# Patient Record
Sex: Female | Born: 1945 | Race: Black or African American | Hispanic: No | Smoking: Never smoker
Health system: Southern US, Community
[De-identification: ages and names within clinical notes are randomized; demographics above are authoritative.]

## PROBLEM LIST (undated history)

## (undated) DIAGNOSIS — G894 Chronic pain syndrome: Secondary | ICD-10-CM

## (undated) DIAGNOSIS — S0501XA Injury of conjunctiva and corneal abrasion without foreign body, right eye, initial encounter: Secondary | ICD-10-CM

## (undated) DIAGNOSIS — S0502XA Injury of conjunctiva and corneal abrasion without foreign body, left eye, initial encounter: Secondary | ICD-10-CM

## (undated) DIAGNOSIS — F32A Depression, unspecified: Secondary | ICD-10-CM

## (undated) DIAGNOSIS — R1310 Dysphagia, unspecified: Secondary | ICD-10-CM

## (undated) DIAGNOSIS — J42 Unspecified chronic bronchitis: Secondary | ICD-10-CM

## (undated) DIAGNOSIS — I10 Essential (primary) hypertension: Secondary | ICD-10-CM

## (undated) DIAGNOSIS — S134XXA Sprain of ligaments of cervical spine, initial encounter: Secondary | ICD-10-CM

## (undated) DIAGNOSIS — I824Z2 Acute embolism and thrombosis of unspecified deep veins of left distal lower extremity: Secondary | ICD-10-CM

## (undated) DIAGNOSIS — M109 Gout, unspecified: Secondary | ICD-10-CM

## (undated) DIAGNOSIS — M4322 Fusion of spine, cervical region: Secondary | ICD-10-CM

## (undated) DIAGNOSIS — G629 Polyneuropathy, unspecified: Secondary | ICD-10-CM

## (undated) DIAGNOSIS — F329 Major depressive disorder, single episode, unspecified: Secondary | ICD-10-CM

## (undated) HISTORY — PX: OTHER SURGICAL HISTORY: SHX169

## (undated) HISTORY — DX: Essential (primary) hypertension: I10

## (undated) HISTORY — DX: Injury of conjunctiva and corneal abrasion without foreign body, right eye, initial encounter: S05.02XA

## (undated) HISTORY — DX: Acute embolism and thrombosis of unspecified deep veins of left distal lower extremity: I82.4Z2

## (undated) HISTORY — DX: Sprain of ligaments of cervical spine, initial encounter: S13.4XXA

## (undated) HISTORY — PX: TUBAL LIGATION: SHX77

## (undated) HISTORY — DX: Fusion of spine, cervical region: M43.22

## (undated) HISTORY — PX: CHOLECYSTECTOMY: SHX55

## (undated) HISTORY — DX: Injury of conjunctiva and corneal abrasion without foreign body, right eye, initial encounter: S05.01XA

---
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
## 2016-05-30 DIAGNOSIS — S134XXA Sprain of ligaments of cervical spine, initial encounter: Secondary | ICD-10-CM

## 2016-05-30 HISTORY — DX: Sprain of ligaments of cervical spine, initial encounter: S13.4XXA

## 2016-06-03 DIAGNOSIS — R079 Chest pain, unspecified: Secondary | ICD-10-CM | POA: Insufficient documentation

## 2016-06-03 DIAGNOSIS — E669 Obesity, unspecified: Secondary | ICD-10-CM | POA: Insufficient documentation

## 2016-06-03 DIAGNOSIS — S0003XA Contusion of scalp, initial encounter: Secondary | ICD-10-CM | POA: Insufficient documentation

## 2016-06-03 DIAGNOSIS — S3011XA Contusion of abdominal wall, initial encounter: Secondary | ICD-10-CM | POA: Insufficient documentation

## 2016-06-03 DIAGNOSIS — E876 Hypokalemia: Secondary | ICD-10-CM | POA: Insufficient documentation

## 2016-06-03 DIAGNOSIS — M4320 Fusion of spine, site unspecified: Secondary | ICD-10-CM | POA: Insufficient documentation

## 2016-06-03 DIAGNOSIS — S0501XA Injury of conjunctiva and corneal abrasion without foreign body, right eye, initial encounter: Secondary | ICD-10-CM | POA: Insufficient documentation

## 2016-06-03 DIAGNOSIS — S301XXA Contusion of abdominal wall, initial encounter: Secondary | ICD-10-CM | POA: Insufficient documentation

## 2016-06-03 DIAGNOSIS — R002 Palpitations: Secondary | ICD-10-CM | POA: Insufficient documentation

## 2016-06-03 DIAGNOSIS — R7303 Prediabetes: Secondary | ICD-10-CM | POA: Insufficient documentation

## 2016-06-03 DIAGNOSIS — S0502XA Injury of conjunctiva and corneal abrasion without foreign body, left eye, initial encounter: Secondary | ICD-10-CM | POA: Insufficient documentation

## 2016-06-03 DIAGNOSIS — Z981 Arthrodesis status: Secondary | ICD-10-CM | POA: Insufficient documentation

## 2016-06-14 DIAGNOSIS — D500 Iron deficiency anemia secondary to blood loss (chronic): Secondary | ICD-10-CM | POA: Insufficient documentation

## 2016-06-14 DIAGNOSIS — I824Z2 Acute embolism and thrombosis of unspecified deep veins of left distal lower extremity: Secondary | ICD-10-CM | POA: Insufficient documentation

## 2016-07-14 ENCOUNTER — Non-Acute Institutional Stay (SKILLED_NURSING_FACILITY): Payer: Medicare Other | Admitting: Internal Medicine

## 2016-07-14 ENCOUNTER — Encounter: Payer: Self-pay | Admitting: Internal Medicine

## 2016-07-14 DIAGNOSIS — S14109D Unspecified injury at unspecified level of cervical spinal cord, subsequent encounter: Secondary | ICD-10-CM

## 2016-07-14 DIAGNOSIS — R21 Rash and other nonspecific skin eruption: Secondary | ICD-10-CM

## 2016-07-14 DIAGNOSIS — I82402 Acute embolism and thrombosis of unspecified deep veins of left lower extremity: Secondary | ICD-10-CM | POA: Diagnosis not present

## 2016-07-14 DIAGNOSIS — I10 Essential (primary) hypertension: Secondary | ICD-10-CM

## 2016-07-14 DIAGNOSIS — J309 Allergic rhinitis, unspecified: Secondary | ICD-10-CM

## 2016-07-14 DIAGNOSIS — F329 Major depressive disorder, single episode, unspecified: Secondary | ICD-10-CM

## 2016-07-14 DIAGNOSIS — R1319 Other dysphagia: Secondary | ICD-10-CM | POA: Diagnosis not present

## 2016-07-14 DIAGNOSIS — S52501S Unspecified fracture of the lower end of right radius, sequela: Secondary | ICD-10-CM

## 2016-07-14 DIAGNOSIS — R531 Weakness: Secondary | ICD-10-CM | POA: Diagnosis not present

## 2016-07-14 DIAGNOSIS — R42 Dizziness and giddiness: Secondary | ICD-10-CM

## 2016-07-14 DIAGNOSIS — K5900 Constipation, unspecified: Secondary | ICD-10-CM | POA: Diagnosis not present

## 2016-07-14 DIAGNOSIS — M792 Neuralgia and neuritis, unspecified: Secondary | ICD-10-CM | POA: Diagnosis not present

## 2016-07-14 DIAGNOSIS — E785 Hyperlipidemia, unspecified: Secondary | ICD-10-CM

## 2016-07-14 DIAGNOSIS — K219 Gastro-esophageal reflux disease without esophagitis: Secondary | ICD-10-CM | POA: Diagnosis not present
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
## 2016-07-15 LAB — CBC AND DIFFERENTIAL
HCT: 31 % — AB (ref 36–46)
Hemoglobin: 10 g/dL — AB (ref 12.0–16.0)
Platelets: 288 10*3/uL (ref 150–399)
WBC: 6 10^3/mL

## 2016-07-15 LAB — HEPATIC FUNCTION PANEL
ALT: 19 U/L (ref 7–35)
AST: 14 U/L (ref 13–35)
Alkaline Phosphatase: 88 U/L (ref 25–125)
Bilirubin, Total: 0.4 mg/dL

## 2016-07-15 LAB — BASIC METABOLIC PANEL
BUN: 14 mg/dL (ref 4–21)
CREATININE: 0.7 mg/dL (ref 0.5–1.1)
Glucose: 97 mg/dL
POTASSIUM: 3.7 mmol/L (ref 3.4–5.3)
Sodium: 144 mmol/L (ref 137–147)

## 2016-07-28 ENCOUNTER — Non-Acute Institutional Stay (SKILLED_NURSING_FACILITY): Payer: Medicare Other | Admitting: Family

## 2016-07-28 DIAGNOSIS — R030 Elevated blood-pressure reading, without diagnosis of hypertension: Secondary | ICD-10-CM

## 2016-07-28 DIAGNOSIS — M79605 Pain in left leg: Secondary | ICD-10-CM | POA: Diagnosis not present

## 2016-07-28 NOTE — Progress Notes (Signed)
Location:  [facility] Room Number: 603  Place of Service:  SNF [phone] Provider: Richarda Bladeinah Taevyn Hausen FNP-C   No care team member to display  Extended Emergency Contact Information Primary Emergency Contact: Howard Young Med CtrMcRae,Debbie Address: [address]          [place], [place] [address] [place] Mobile Phone: [phone] Relation: Daughter Secondary Emergency Contact: Crawford,Tina  [place] Mobile Phone: [phone] Relation: Daughter  Code Status:  Full Code  Goals of care: Advanced Directive information No flowsheet data found.   Chief Complaint  Patient presents with  . Acute Visit    Left leg pain     HPI:  Pt is a 70 y.o. female [facility] for an acute visit for evaluation of pain. She has a medical history of HTN, Hyperlipidemia, GERD among other conditions. She is seen in her room today. She complains of generalized pain but states worst on left leg. She has taken Tylenol without any relief. She states tried to tolerate pain at times.She continues to work with PT/OT.Facility Nurse reports elevated B/P this visit but possible due to pain. Will monitor for now.    Past Medical History:  Diagnosis Date  . Hypertension    No past surgical history on file.  Allergies  Allergen Reactions  . Ivp Dye [Iodinated Diagnostic Agents]   . Penicillins       Medication List       Accurate as of 07/28/16  3:35 PM. Always use your most recent med list.          acetaminophen 325 MG tablet Commonly known as:  TYLENOL Take 650 mg by mouth every 6 (six) hours as needed for mild pain.   allopurinol 100 MG tablet Commonly known as:  ZYLOPRIM Place 100 mg into feeding tube daily.   alum & mag hydroxide-simeth 400-400-40 MG/5ML suspension Commonly known as:  MAALOX PLUS Take 30 mLs by mouth every 6 (six) hours as needed for indigestion. Stop date 07/15/16   amLODipine 10 MG  tablet Commonly known as:  NORVASC Place 10 mg into feeding tube daily.   atorvastatin 40 MG tablet Commonly known as:  LIPITOR Place 40 mg into feeding tube at bedtime.   baclofen 10 MG tablet Commonly known as:  LIORESAL Place 15 mg into feeding tube at bedtime.   bisacodyl 10 MG suppository Commonly known as:  DULCOLAX Place 10 mg rectally every other day.   calcium carbonate 500 MG chewable tablet Commonly known as:  TUMS - dosed in mg elemental calcium Chew 1 tablet by mouth every 4 (four) hours as needed for indigestion or heartburn.   carboxymethylcellulose 0.5 % Soln Commonly known as:  REFRESH PLUS Place 1 drop into both eyes 4 (four) times daily.   carvedilol 6.25 MG tablet Commonly known as:  COREG Place 6.25 mg into feeding tube 2 (two) times daily with a meal.   diclofenac sodium 1 % Gel Commonly known as:  VOLTAREN Apply 2 g topically 4 (four) times daily.   docusate 50 MG/5ML liquid Commonly known as:  COLACE Place into feeding tube 2 (two) times daily. Give 10 mL   FIORICET/CODEINE 50-300-40-30 MG Caps Generic drug:  Butalbital-APAP-Caff-Cod Place 1 capsule into feeding tube every 4 (four) hours as needed.   gabapentin 400 MG capsule Commonly known as:  NEURONTIN Take 500 mg by mouth 3 (three) times daily. Take along with Gabapentin 100 mg Tablet  gabapentin 100 MG capsule Commonly known as:  NEURONTIN Take 100 mg by mouth 3 (three) times daily. Take along with 400 mg Tablet   guaiFENesin 100 MG/5ML liquid Commonly known as:  ROBITUSSIN Place into feeding tube every 4 (four) hours as needed for cough. Give 10 mL   lidocaine 5 % Commonly known as:  LIDODERM Place 1 patch onto the skin daily. Remove & Discard patch within 12 hours or as directed by MD   meclizine 12.5 MG tablet Commonly known as:  ANTIVERT Place 12.5 mg into feeding tube 3 (three) times daily as needed for dizziness.   Melatonin 3 MG Tabs Take 3 mg by mouth at bedtime.    montelukast 10 MG tablet Commonly known as:  SINGULAIR Place 10 mg into feeding tube at bedtime.   nystatin powder Commonly known as:  MYCOSTATIN/NYSTOP Apply topically 2 (two) times daily.   omeprazole 40 MG capsule Commonly known as:  PRILOSEC Take 40 mg by mouth daily.   ondansetron 4 MG tablet Commonly known as:  ZOFRAN Take 4 mg by mouth every 6 (six) hours as needed for nausea or vomiting. Stop date 07/19/16   rivaroxaban 20 MG Tabs tablet Commonly known as:  XARELTO Take 20 mg by mouth daily with supper.   senna 8.6 MG tablet Commonly known as:  SENOKOT Place 2 tablets into feeding tube daily.       Review of Systems  Constitutional: Negative for activity change, appetite change, chills, fatigue and fever.  HENT: Negative for congestion, rhinorrhea, sinus pressure, sneezing and sore throat.   Eyes: Negative.   Respiratory: Negative for cough, choking, chest tightness, shortness of breath and wheezing.   Cardiovascular: Negative for chest pain, palpitations and leg swelling.  Gastrointestinal: Negative for abdominal distention, abdominal pain, blood in stool, constipation, diarrhea, nausea and vomiting.  Endocrine: Negative.   Genitourinary: Negative for difficulty urinating, dysuria, frequency and urgency.  Musculoskeletal: Positive for gait problem.       Generalized pain but worst on left leg Tylenol ineffective  Skin: Negative for color change, pallor and rash.  Neurological: Negative for dizziness, seizures, syncope, light-headedness and headaches.  Hematological: Does not bruise/bleed easily.  Psychiatric/Behavioral: Negative for agitation, confusion, hallucinations and sleep disturbance. The patient is not nervous/anxious.      There is no immunization history on file for this patient. There are no preventive care reminders to display for this patient. No flowsheet data found. Functional Status Survey:    Vitals:   07/28/16 1130  BP: (!) 163/74   Pulse: 88  Resp: 18  Temp: 97.3 F (36.3 C)  SpO2: 97%  Weight: 208 lb 9.6 oz (94.6 kg)  Height: 5\' 2"  (1.575 m)   Body mass index is 38.15 kg/m. Physical Exam  Constitutional: She is oriented to person, place, and time. She appears well-developed and well-nourished. No distress.  HENT:  Head: Normocephalic.  Mouth/Throat: Oropharynx is clear and moist. No oropharyngeal exudate.  Eyes: Conjunctivae and EOM are normal. Pupils are equal, round, and reactive to light. Right eye exhibits no discharge. Left eye exhibits no discharge. No scleral icterus.  Neck: Normal range of motion. No JVD present. No thyromegaly present.  Cardiovascular: Normal rate, regular rhythm, normal heart sounds and intact distal pulses.  Exam reveals no gallop and no friction rub.   No murmur heard. Pulmonary/Chest: Effort normal and breath sounds normal. No respiratory distress. She has no wheezes. She has no rales.  Abdominal: Soft. Bowel sounds are normal. She exhibits  no distension. There is no tenderness. There is no rebound and no guarding.  Musculoskeletal: She exhibits no edema, tenderness or deformity.  Moves x 4 extremities. Wears hard  Neck collar Bilat. Ted hose in place.   Lymphadenopathy:    She has no cervical adenopathy.  Neurological: She is oriented to person, place, and time.  Skin: Skin is warm and dry. No rash noted. No erythema. No pallor.  Left ankle drsg dry, clean and intact   Psychiatric: She has a normal mood and affect.    Assessment/Plan 1. Left leg pain Tylenol ineffective. Will add Tramadol 50 mg Tablet 1/2-1 Tablet  by mouth every 6 hours for pain. Hold for sedation. One tablet for moderate pain and two tablets for severe pain.   2. Elevated Blood pressure  Suspect due to pain. Continue on Amlodipine 10 mg Tablet. Monitor B/P and HR every shift x 5 days. Facility Nurse to notify Provider if SBP > 160     Family/ staff Communication: Reviewed plan of care with patient and  facility Nurse supervisor.   Labs/tests ordered: None

## 2016-08-24 LAB — HEPATIC FUNCTION PANEL
ALT: 37 U/L — AB (ref 7–35)
AST: 27 U/L (ref 13–35)
Alkaline Phosphatase: 88 U/L (ref 25–125)
Bilirubin, Direct: 0.07 mg/dL (ref 0.01–0.4)
Bilirubin, Total: 0.4 mg/dL

## 2016-08-27 ENCOUNTER — Non-Acute Institutional Stay (SKILLED_NURSING_FACILITY): Payer: Medicare Other | Admitting: Family

## 2016-08-27 ENCOUNTER — Encounter: Payer: Self-pay | Admitting: Family

## 2016-08-27 DIAGNOSIS — R140 Abdominal distension (gaseous): Secondary | ICD-10-CM

## 2016-08-27 NOTE — Progress Notes (Signed)
Location:  [facility] Room Number: 603-P Place of Service:  SNF [phone] Provider: Richarda Bladeinah Ngetich FNP-C   No care team member to display  Extended Emergency Contact Information Primary Emergency Contact: [facility] Address: [address]          [place], [place] [address] [place] Mobile Phone: [phone] Relation: Daughter Secondary Emergency Contact: Crawford,Tina  [place] Mobile Phone: [phone] Relation: Daughter  Code Status:  Full Code  Goals of care: Advanced Directive information No flowsheet data found.   Chief Complaint  Patient presents with  . Acute Visit    "Stomach feels swollen"    HPI:  Brandy Edwards is a 70 y.o. female seen today at [facility] for an acute  visit for evaluation of abdominal bloating. Brandy Edwards has a medical history of HTN, Hyperlipidemia, GERD among other conditions. Brandy Edwards is seen in her room today. Brandy Edwards states her stomach feels like its more swollen than usual for the past two days. Also states feels bloated.States had a small BM previous day. Brandy Edwards request PEG tube to be taken out currently tolerating oral intake. Brandy Edwards is aware of upcoming appointment  with Gastroenterology in January 2018 for PEG tube removal.  Brandy Edwards denies any abdominal pain fever, chills, nausea or vomiting.   Past Medical History:  Diagnosis Date  . Acute deep vein thrombosis (DVT) of distal end of left lower extremity (HCC)   . Bilateral corneal abrasions   . Fusion of spine, cervical region   . Hypertension   . Injury to ligament of cervical spine 05/2016   Past Surgical History:  Procedure Laterality Date  . CORPECTOMY C6      Allergies  Allergen Reactions  . Ivp Dye [Iodinated Diagnostic Agents]   . Penicillins     Allergies as of 08/27/2016      Reactions   Ivp Dye [iodinated Diagnostic Agents]    Penicillins       Medication List       Accurate as of 08/27/16 12:44 PM. Always use your  most recent med list.          acetaminophen 325 MG tablet Commonly known as:  TYLENOL Take 650 mg by mouth every 6 (six) hours as needed for mild pain.   allopurinol 100 MG tablet Commonly known as:  ZYLOPRIM Place 100 mg into feeding tube daily.   alum & mag hydroxide-simeth 400-400-40 MG/5ML suspension Commonly known as:  MAALOX PLUS Take 30 mLs by mouth every 6 (six) hours as needed for indigestion. Stop date 07/15/16   amLODipine 10 MG tablet Commonly known as:  NORVASC Place 10 mg into feeding tube daily.   atorvastatin 40 MG tablet Commonly known as:  LIPITOR Place 40 mg into feeding tube at bedtime.   baclofen 10 MG tablet Commonly known as:  LIORESAL Place 15 mg into feeding tube at bedtime. 1-1/2 tabs to = 15 mg   baclofen 10 MG tablet Commonly known as:  LIORESAL Take 10 mg by mouth 2 (two) times daily.   bisacodyl 10 MG suppository Commonly known as:  DULCOLAX Place 10 mg rectally daily as needed for moderate constipation.   calcium carbonate 500 MG chewable tablet Commonly known as:  TUMS - dosed in mg elemental calcium Chew 1 tablet by mouth every 4 (four) hours as needed for indigestion or heartburn.   Carboxymethylcellulose Sod PF 0.25 % Soln Apply 1 drop to eye 4 (four) times daily.  carvedilol 6.25 MG tablet Commonly known as:  COREG Place 6.25 mg into feeding tube 2 (two) times daily with a meal.   DESOWEN 0.05 % lotion Generic drug:  desonide Apply 1 application topically 2 (two) times daily. Apply to peri-nasal/nose   diclofenac sodium 1 % Gel Commonly known as:  VOLTAREN Apply 2 g topically 4 (four) times daily.   docusate sodium 100 MG capsule Commonly known as:  COLACE Take 100 mg by mouth 2 (two) times daily.   feeding supplement (PRO-STAT SUGAR FREE 64) Liqd Take 30 mLs by mouth 2 (two) times daily.   FIORICET/CODEINE 50-300-40-30 MG Caps Generic drug:  Butalbital-APAP-Caff-Cod Place 1 capsule into feeding tube every 4 (four)  hours as needed.   gabapentin 400 MG capsule Commonly known as:  NEURONTIN Take 400 mg by mouth 3 (three) times daily. Take along with Gabapentin 100 mg Tablet   gabapentin 100 MG capsule Commonly known as:  NEURONTIN Take 100 mg by mouth 3 (three) times daily. Take along with 400 mg Tablet   guaiFENesin 100 MG/5ML liquid Commonly known as:  ROBITUSSIN Place into feeding tube every 4 (four) hours as needed for cough. Give 10 mL   lidocaine 5 % Commonly known as:  LIDODERM Place 1 patch onto the skin daily. Remove & Discard patch within 12 hours or as directed by MD   meclizine 12.5 MG tablet Commonly known as:  ANTIVERT Place 12.5 mg into feeding tube 3 (three) times daily as needed for dizziness.   Melatonin 3 MG Tabs Take 3 mg by mouth at bedtime.   montelukast 10 MG tablet Commonly known as:  SINGULAIR Place 10 mg into feeding tube at bedtime.   nystatin powder Commonly known as:  MYCOSTATIN/NYSTOP Apply topically 2 (two) times daily. Apply to skin fold fungal rash   omeprazole 40 MG capsule Commonly known as:  PRILOSEC Take 40 mg by mouth 2 (two) times daily.   rivaroxaban 20 MG Tabs tablet Commonly known as:  XARELTO 20 mg daily with supper. Crush and administer via PEG tube   senna 8.6 MG tablet Commonly known as:  SENOKOT Place 1 tablet into feeding tube daily.   sertraline 25 MG tablet Commonly known as:  ZOLOFT Place 25 mg into feeding tube daily.   traMADol 50 MG tablet Commonly known as:  ULTRAM Take 25-50 mg by mouth every 6 (six) hours as needed for moderate pain or severe pain. Take 1/2 tablet to = 25 mg for mild to moderate pain, take 50 mg for severe pain       Review of Systems  Constitutional: Negative for activity change, appetite change, chills, fatigue and fever.  HENT: Negative for congestion, rhinorrhea, sinus pressure, sneezing and sore throat.   Eyes: Negative.   Respiratory: Negative for cough, choking, chest tightness, shortness  of breath and wheezing.   Cardiovascular: Negative for chest pain, palpitations and leg swelling.  Gastrointestinal: Positive for abdominal distention. Negative for abdominal pain, blood in stool, constipation, diarrhea, nausea and vomiting.  Endocrine: Negative.   Genitourinary: Negative for difficulty urinating, dysuria, frequency and urgency.  Musculoskeletal: Positive for gait problem.  Skin: Negative for color change, pallor and rash.  Neurological: Negative for dizziness, seizures, syncope, light-headedness and headaches.  Psychiatric/Behavioral: Negative for agitation, confusion, hallucinations and sleep disturbance. The patient is not nervous/anxious.      There is no immunization history on file for this patient. Pertinent  Health Maintenance Due  Topic Date Due  . MAMMOGRAM  07/16/1994  .  COLONOSCOPY  07/16/1994  . DEXA SCAN  07/16/2009  . INFLUENZA VACCINE  08/30/2017 (Originally 03/30/2016)  . PNA vac Low Risk Adult (1 of 2 - PCV13) 08/30/2017 (Originally 07/16/2009)    Vitals:   08/27/16 0843  BP: 101/60  Pulse: 72  Resp: 17  Temp: 97 F (36.1 C)  TempSrc: Oral  SpO2: 96%  Weight: 208 lb 8.9 oz (94.6 kg)  Height: 5\' 2"  (1.575 m)   Body mass index is 38.15 kg/m. Physical Exam  Constitutional: Brandy Edwards is oriented to person, place, and time. Brandy Edwards appears well-developed and well-nourished. No distress.  HENT:  Head: Normocephalic.  Mouth/Throat: Oropharynx is clear and moist. No oropharyngeal exudate.  Eyes: Conjunctivae and EOM are normal. Pupils are equal, round, and reactive to light. Right eye exhibits no discharge. Left eye exhibits no discharge. No scleral icterus.  Neck: Normal range of motion. No JVD present. No thyromegaly present.  Cardiovascular: Normal rate, regular rhythm, normal heart sounds and intact distal pulses.  Exam reveals no gallop and no friction rub.   No murmur heard. Pulmonary/Chest: Effort normal and breath sounds normal. No respiratory  distress. Brandy Edwards has no wheezes. Brandy Edwards has no rales.  Abdominal: Soft. Brandy Edwards exhibits no distension. There is no tenderness. There is no rebound and no guarding.  Hyperactive bowel sounds upper quadrant. PEG tube.   Musculoskeletal: Brandy Edwards exhibits no edema, tenderness or deformity.  Moves x 4 extremities. Wears hard  Neck collar Bilat. Ted hose in place.   Lymphadenopathy:    Brandy Edwards has no cervical adenopathy.  Neurological: Brandy Edwards is oriented to person, place, and time.  Skin: Skin is warm and dry. No rash noted. No erythema. No pallor.  Psychiatric: Brandy Edwards has a normal mood and affect.    Assessment/Plan Abdominal bloating Feels like the abdomen is swollen. Exam findings + BS but hyperactive.Had small BM 08/26/2016. Will obtain portable KUB rule out obstruction and constipation.     Family/ staff Communication: Reviewed plan of care with patient and facility Nurse supervisor.   Labs/tests ordered: Portable KUB rule out constipation.

## 2016-09-23 ENCOUNTER — Non-Acute Institutional Stay (SKILLED_NURSING_FACILITY): Payer: Medicare Other | Admitting: Family

## 2016-09-23 DIAGNOSIS — I10 Essential (primary) hypertension: Secondary | ICD-10-CM

## 2016-09-23 DIAGNOSIS — L89152 Pressure ulcer of sacral region, stage 2: Secondary | ICD-10-CM

## 2016-09-23 DIAGNOSIS — G629 Polyneuropathy, unspecified: Secondary | ICD-10-CM

## 2016-09-23 DIAGNOSIS — G4700 Insomnia, unspecified: Secondary | ICD-10-CM | POA: Diagnosis not present

## 2016-09-23 DIAGNOSIS — F329 Major depressive disorder, single episode, unspecified: Secondary | ICD-10-CM

## 2016-09-23 DIAGNOSIS — F32A Depression, unspecified: Secondary | ICD-10-CM

## 2016-09-23 DIAGNOSIS — Z931 Gastrostomy status: Secondary | ICD-10-CM

## 2016-09-23 NOTE — Progress Notes (Signed)
Location:  [facility] Room Number: 603 P  Place of Service:  SNF [phone]) Provider: Richarda Bladeinah Sanvi Ehler FNP-C   No care team member to display  Extended Emergency Contact Information Primary Emergency Contact: [facility]Deb[facility] Address: [address]          [place], [place] [address] [place] Mobile Phone: [phone] Relation: Daughter Secondary Emergency Contact: Crawford,Tina  [place] Mobile Phone: [phone] Relation: Daughter  Code Status:  Full Code  Goals of care: Advanced Directive information No flowsheet data found.   Chief Complaint  Patient presents with  . Medical Management of Chronic Issues    HPI:  Pt is a 71 y.o. female seen today at [facility] for medical management of chronic issues.She has a medical history of HTN, Hyperlipidemia, GERD, depression, Neuropathy, Gout among other conditions. She is seen in her room today.She complains of left hand pain states awaiting pain medication to be brought by Nurse.Her PEG tube was removed by Gastroenterologist site healed without any complication. She tolerating oral intake with any difficulties.She has a stage 2 sacral pressure ulcer managed by facility wound care Nurse.She has had no recent fall episodes, weight changes or illness.    Past Medical History:  Diagnosis Date  . Acute deep vein thrombosis (DVT) of distal end of left lower extremity (HCC)   . Bilateral corneal abrasions   . Fusion of spine, cervical region   . Hypertension   . Injury to ligament of cervical spine 05/2016   Past Surgical History:  Procedure Laterality Date  . CORPECTOMY C6      Allergies  Allergen Reactions  . Ivp Dye [Iodinated Diagnostic Agents]   . Penicillins     Allergies as of 09/23/2016      Reactions   Ivp Dye [iodinated Diagnostic Agents]    Penicillins       Medication List       Accurate as of 09/23/16  5:53 PM. Always use your most  recent med list.          acetaminophen 325 MG tablet Commonly known as:  TYLENOL Take 650 mg by mouth every 6 (six) hours as needed for mild pain.   allopurinol 100 MG tablet Commonly known as:  ZYLOPRIM Take 100 mg by mouth daily.   alum & mag hydroxide-simeth 400-400-40 MG/5ML suspension Commonly known as:  MAALOX PLUS Take 30 mLs by mouth every 6 (six) hours as needed for indigestion. Stop date 07/15/16   amLODipine 10 MG tablet Commonly known as:  NORVASC Take 10 mg by mouth daily.   atorvastatin 40 MG tablet Commonly known as:  LIPITOR Take 40 mg by mouth at bedtime.   baclofen 10 MG tablet Commonly known as:  LIORESAL Take 15 mg by mouth at bedtime. 1-1/2 tabs to = 15 mg   baclofen 10 MG tablet Commonly known as:  LIORESAL Take 10 mg by mouth 2 (two) times daily.   bisacodyl 10 MG suppository Commonly known as:  DULCOLAX Place 10 mg rectally daily as needed for moderate constipation.   calcium carbonate 500 MG chewable tablet Commonly known as:  TUMS - dosed in mg elemental calcium Chew 1 tablet by mouth every 4 (four) hours as needed for indigestion or heartburn.   Carboxymethylcellulose Sod PF 0.25 % Soln Apply 1 drop to eye 4 (four) times daily.   carvedilol 6.25 MG tablet Commonly known as:  COREG Take 6.25 mg by mouth 2 (  two) times daily with a meal.   DESOWEN 0.05 % lotion Generic drug:  desonide Apply 1 application topically 2 (two) times daily. Apply to peri-nasal/nose   diclofenac sodium 1 % Gel Commonly known as:  VOLTAREN Apply 2 g topically 4 (four) times daily.   docusate sodium 100 MG capsule Commonly known as:  COLACE Take 100 mg by mouth 2 (two) times daily.   FIORICET/CODEINE 50-300-40-30 MG Caps Generic drug:  Butalbital-APAP-Caff-Cod Take 1 capsule by mouth every 4 (four) hours as needed.   gabapentin 400 MG capsule Commonly known as:  NEURONTIN Take 400 mg by mouth 3 (three) times daily. Take along with Gabapentin 100 mg  Tablet   gabapentin 100 MG capsule Commonly known as:  NEURONTIN Take 100 mg by mouth 3 (three) times daily. Take along with 400 mg Tablet   guaiFENesin 100 MG/5ML liquid Commonly known as:  ROBITUSSIN Take by mouth every 4 (four) hours as needed for cough. Give 10 mL   lidocaine 5 % Commonly known as:  LIDODERM Place 1 patch onto the skin daily. Remove & Discard patch within 12 hours or as directed by MD   meclizine 12.5 MG tablet Commonly known as:  ANTIVERT Take 12.5 mg by mouth 3 (three) times daily as needed for dizziness.   Melatonin 3 MG Tabs Take 3 mg by mouth at bedtime.   montelukast 10 MG tablet Commonly known as:  SINGULAIR Take 10 mg by mouth at bedtime.   nystatin powder Commonly known as:  MYCOSTATIN/NYSTOP Apply topically 2 (two) times daily. Apply to skin fold fungal rash   omeprazole 40 MG capsule Commonly known as:  PRILOSEC Take 40 mg by mouth 2 (two) times daily.   rivaroxaban 20 MG Tabs tablet Commonly known as:  XARELTO 20 mg daily with supper. Crush and administer via PEG tube   senna 8.6 MG tablet Commonly known as:  SENOKOT Take 1 tablet by mouth daily.   sertraline 25 MG tablet Commonly known as:  ZOLOFT Take 25 mg by mouth daily.   traMADol 50 MG tablet Commonly known as:  ULTRAM Take 25-50 mg by mouth every 6 (six) hours as needed for moderate pain or severe pain. Take 1/2 tablet to = 25 mg for mild to moderate pain, take 50 mg for severe pain       Review of Systems  Constitutional: Negative for activity change, appetite change, chills, fatigue and fever.  HENT: Negative for congestion, rhinorrhea, sinus pressure, sneezing and sore throat.   Eyes: Negative.   Respiratory: Negative for cough, choking, chest tightness, shortness of breath and wheezing.   Cardiovascular: Negative for chest pain, palpitations and leg swelling.  Gastrointestinal: Negative for abdominal distention, abdominal pain, blood in stool, constipation,  diarrhea, nausea and vomiting.  Endocrine: Negative.   Genitourinary: Negative for difficulty urinating, dysuria, frequency and urgency.  Musculoskeletal: Positive for gait problem.  Skin: Negative for color change, pallor and rash.       Sacral ulcer managed by wound care Nurse   Neurological: Negative for dizziness, seizures, syncope, light-headedness and headaches.  Hematological: Does not bruise/bleed easily.  Psychiatric/Behavioral: Negative for agitation, confusion, hallucinations and sleep disturbance. The patient is not nervous/anxious.      There is no immunization history on file for this patient. Pertinent  Health Maintenance Due  Topic Date Due  . MAMMOGRAM  07/16/1994  . COLONOSCOPY  07/16/1994  . DEXA SCAN  07/16/2009  . INFLUENZA VACCINE  08/30/2017 (Originally 03/30/2016)  . PNA  vac Low Risk Adult (1 of 2 - PCV13) 08/30/2017 (Originally 07/16/2009)    Vitals:   09/23/16 1115  BP: 130/74  Pulse: 88  Resp: 20  Temp: (!) 96.9 F (36.1 C)  SpO2: 96%  Weight: 199 lb (90.3 kg)  Height: 5\' 2"  (1.575 m)   Body mass index is 36.4 kg/m. Physical Exam  Constitutional: She is oriented to person, place, and time. She appears well-nourished. No distress.  Morbid obese  HENT:  Head: Normocephalic.  Mouth/Throat: Oropharynx is clear and moist. No oropharyngeal exudate.  Eyes: Conjunctivae and EOM are normal. Pupils are equal, round, and reactive to light. Right eye exhibits no discharge. Left eye exhibits no discharge. No scleral icterus.  Neck: Normal range of motion. No JVD present. No thyromegaly present.  Cardiovascular: Normal rate, regular rhythm, normal heart sounds and intact distal pulses.  Exam reveals no gallop and no friction rub.   No murmur heard. Pulmonary/Chest: Effort normal and breath sounds normal. No respiratory distress. She has no wheezes. She has no rales.  Abdominal: Soft. Bowel sounds are normal. She exhibits no distension. There is no tenderness.  There is no rebound and no guarding.     Musculoskeletal: She exhibits no edema, tenderness or deformity.  Moves x 4 extremities.Neck hard collar in place. Bilateral  Ted hose in place.   Lymphadenopathy:    She has no cervical adenopathy.  Neurological: She is oriented to person, place, and time.  Skin: Skin is warm and dry. No rash noted. No erythema. No pallor.  1. Stage 2 Pressure ulcer: wound bed pink in color with few scattered areas with yellow skin tissue. Surrounding skin without any signs of infections.   2.Mid umbilicus previous PEG tube site scab noted. No drainage or signs of infections.   Psychiatric: She has a normal mood and affect.    Assessment/Plan HTN  B/p stable. Continue on Norvasc and Coreg. Monitor BMP.    Depression  Stable. Continue on Zoloft. Monitor for mood changes.   Neuropathy Continue on Gabapentin. Continue to control high risk factors.   Insomnia Reports not sleeping well but keeps TV on during the night. Sleep hygiene encouraged. Continue on Melatonin. Will increase dose if still unable to sleep with practicing sleep hygiene.    Sacral Pressure ulcer  No signs of infections. Encourage oral intake. Continue protein supplements. Continue wound care.   PEG site  Peg removed by GI. Site without any signs of infections.D/C dressing to peg site. Cleanse area with saline , pat dry and leave open to air. Monitor site for any signs of infections or drainage.    Family/ staff Communication: Reviewed plan of care with patient and facility Nurse supervisor.   Labs/tests ordered: None

## 2016-10-20 ENCOUNTER — Non-Acute Institutional Stay (SKILLED_NURSING_FACILITY): Payer: Medicare Other | Admitting: Family

## 2016-10-20 DIAGNOSIS — F32A Depression, unspecified: Secondary | ICD-10-CM

## 2016-10-20 DIAGNOSIS — F329 Major depressive disorder, single episode, unspecified: Secondary | ICD-10-CM | POA: Diagnosis not present

## 2016-10-20 DIAGNOSIS — I10 Essential (primary) hypertension: Secondary | ICD-10-CM

## 2016-10-20 DIAGNOSIS — L89153 Pressure ulcer of sacral region, stage 3: Secondary | ICD-10-CM | POA: Diagnosis not present

## 2016-10-20 DIAGNOSIS — G629 Polyneuropathy, unspecified: Secondary | ICD-10-CM

## 2016-10-20 DIAGNOSIS — R059 Cough, unspecified: Secondary | ICD-10-CM

## 2016-10-20 DIAGNOSIS — M79605 Pain in left leg: Secondary | ICD-10-CM | POA: Diagnosis not present

## 2016-10-20 DIAGNOSIS — R05 Cough: Secondary | ICD-10-CM | POA: Diagnosis not present

## 2016-10-20 NOTE — Progress Notes (Signed)
Location:  [facility] Room Number: 603 Place of Service:  SNF [phone]) Provider: Richarda Blade FNP-C   No care team member to display  Extended Emergency Contact Information Primary Emergency Contact: [facility] Address: [address]          [place], [place] [address] D[place] Phone: [phone] Relation: Daughter Secondary Emergency Contact: Wetzel [place] Mobile Phone: [phone] Relation: Daughter  Code Status:  Full Code  Goals of care: Advanced Directive information Advanced Directives 10/20/2016  Does Patient Have a Medical Advance Directive? No     Chief Complaint  Patient presents with  . Medical Management of Chronic Issues    Routine Visit    HPI:  Pt is a 71 y.o. female seen today at [facility] for medical management of chronic issues.She has a medical history of HTN, Hyperlipidemia, GERD, depression, Neuropathy, Gout among other conditions. She is seen in her room today.She complains of left worsening cough. She states productive at times. Also complains of left leg pain not relieved by current pain regimen.she denies any fever or chills. She has a stage 3 Pressure ulcer to sacral area managed by wound care nurse. She has had a 8.8 lbs weight loss over three months. She is currently following up with Registered dietician for protein supplements.   Past Medical History:  Diagnosis Date  . Acute deep vein thrombosis (DVT) of distal end of left lower extremity (HCC)   . Bilateral corneal abrasions   . Fusion of spine, cervical region   . Hypertension   . Injury to ligament of cervical spine 05/2016   Past Surgical History:  Procedure Laterality Date  . CORPECTOMY C6      Allergies  Allergen Reactions  . Ivp Dye [Iodinated Diagnostic Agents]   . Penicillins     Allergies as of 10/20/2016      Reactions   Ivp Dye [iodinated Diagnostic Agents]    Penicillins       Medication List       Accurate as of 10/20/16  1:24 PM. Always use your most recent med list.          acetaminophen 325 MG tablet Commonly known as:  TYLENOL Take 650 mg by mouth every 6 (six) hours as needed for mild pain.   allopurinol 100 MG tablet Commonly known as:  ZYLOPRIM Place 100 mg into feeding tube daily.   alum & mag hydroxide-simeth 400-400-40 MG/5ML suspension Commonly known as:  MAALOX PLUS Take 30 mLs by mouth every 6 (six) hours as needed for indigestion. Stop date 07/15/16   amLODipine 10 MG tablet Commonly known as:  NORVASC Place 10 mg into feeding tube daily. Flush with  10 ml between meds   atorvastatin 40 MG tablet Commonly known as:  LIPITOR Place 40 mg into feeding tube at bedtime.   baclofen 10 MG tablet Commonly known as:  LIORESAL Place 15 mg into feeding tube at bedtime. 1 and 1/2 tablets   bisacodyl 10 MG suppository Commonly known as:  DULCOLAX Place 10 mg rectally daily as needed for moderate constipation.   calcium carbonate 500 MG chewable tablet Commonly known as:  TUMS - dosed in mg elemental calcium Chew 1 tablet by mouth every 4 (four) hours as needed for indigestion or heartburn.   Carboxymethylcellulose Sod PF 0.25 % Soln Place 1 drop into both eyes 4 (four) times daily.   carvedilol 6.25 MG tablet Commonly  known as:  COREG Place 6.25 mg into feeding tube 2 (two) times daily with a meal. Administer crushed   DECUBI-VITE PO Take 1 tablet by mouth daily.   DESOWEN 0.05 % lotion Generic drug:  desonide Apply 1 application topically 2 (two) times daily. Apply to peri-nasal/nose   diclofenac sodium 1 % Gel Commonly known as:  VOLTAREN Apply 2 g topically 4 (four) times daily. To left lower leg   docusate sodium 100 MG capsule Commonly known as:  COLACE Take 100 mg by mouth 2 (two) times daily as needed for mild constipation.   feeding supplement (PRO-STAT SUGAR FREE 64) Liqd Take 30 mLs by mouth 2  (two) times daily.   FIORICET/CODEINE 50-300-40-30 MG Caps Generic drug:  Butalbital-APAP-Caff-Cod Place 1 capsule into feeding tube every 4 (four) hours as needed. For headache. Flush with 10 ml between meds   gabapentin 400 MG capsule Commonly known as:  NEURONTIN Take 400 mg by mouth 3 (three) times daily. Take along with Gabapentin 100 mg Tablet   gabapentin 100 MG capsule Commonly known as:  NEURONTIN Take 100 mg by mouth 3 (three) times daily. Take along with 400 mg Tablet   guaiFENesin 100 MG/5ML liquid Commonly known as:  ROBITUSSIN Place 10 mLs into feeding tube every 4 (four) hours as needed for cough. Give 10 mL   lidocaine 5 % Commonly known as:  LIDODERM Place 1 patch onto the skin daily. Remove & Discard patch within 12 hours or as directed by MD apply at 9 am and remove at 9 pm.   meclizine 12.5 MG tablet Commonly known as:  ANTIVERT Place 12.5 mg into feeding tube 3 (three) times daily as needed for dizziness.   Melatonin 3 MG Tabs Take 3 mg by mouth at bedtime.   montelukast 10 MG tablet Commonly known as:  SINGULAIR Place 10 mg into feeding tube at bedtime.   nystatin powder Commonly known as:  MYCOSTATIN/NYSTOP Apply topically 2 (two) times daily. Apply to skin fold fungal rash   omeprazole 40 MG capsule Commonly known as:  PRILOSEC Take 40 mg by mouth 2 (two) times daily.   rivaroxaban 20 MG Tabs tablet Commonly known as:  XARELTO 20 mg daily with supper. Crush and administer via PEG tube   senna 8.6 MG tablet Commonly known as:  SENOKOT Take 1 tablet by mouth daily.   sertraline 25 MG tablet Commonly known as:  ZOLOFT Place 25 mg into feeding tube daily.   traMADol 50 MG tablet Commonly known as:  ULTRAM Take 25-50 mg by mouth every 6 (six) hours as needed for moderate pain or severe pain. Take 1/2 tablet to = 25 mg for mild to moderate pain, take 50 mg for severe pain       Review of Systems  Constitutional: Negative for activity  change, appetite change, chills, fatigue and fever.  HENT: Negative for congestion, rhinorrhea, sinus pressure, sneezing and sore throat.   Eyes: Negative.   Respiratory: Positive for cough. Negative for choking, chest tightness, shortness of breath and wheezing.   Cardiovascular: Negative for chest pain, palpitations and leg swelling.  Gastrointestinal: Negative for abdominal distention, abdominal pain, blood in stool, constipation, diarrhea, nausea and vomiting.  Endocrine: Negative.   Genitourinary: Negative for difficulty urinating, dysuria, frequency and urgency.  Musculoskeletal: Positive for gait problem.       Left leg pain   Skin: Negative for color change, pallor and rash.       Sacral ulcer managed by  wound care Nurse   Neurological: Negative for dizziness, seizures, syncope, light-headedness and headaches.  Hematological: Does not bruise/bleed easily.  Psychiatric/Behavioral: Negative for agitation, confusion, hallucinations and sleep disturbance. The patient is not nervous/anxious.      There is no immunization history on file for this patient. Pertinent  Health Maintenance Due  Topic Date Due  . MAMMOGRAM  07/16/1994  . COLONOSCOPY  07/16/1994  . DEXA SCAN  07/16/2009  . INFLUENZA VACCINE  08/30/2017 (Originally 03/30/2016)  . PNA vac Low Risk Adult (1 of 2 - PCV13) 08/30/2017 (Originally 07/16/2009)    Vitals:   10/20/16 1253  BP: (!) 132/55  Pulse: 78  Resp: 20  Temp: 97.6 F (36.4 C)  SpO2: 92%  Weight: 192 lb 12.8 oz (87.5 kg)  Height: 5\' 2"  (1.575 m)   Body mass index is 35.26 kg/m. Physical Exam  Constitutional: She is oriented to person, place, and time. She appears well-nourished. No distress.  Morbid obese  HENT:  Head: Normocephalic.  Mouth/Throat: Oropharynx is clear and moist. No oropharyngeal exudate.  Eyes: Conjunctivae and EOM are normal. Pupils are equal, round, and reactive to light. Right eye exhibits no discharge. Left eye exhibits no  discharge. No scleral icterus.  Neck: Normal range of motion. No JVD present. No thyromegaly present.  Cardiovascular: Normal rate, regular rhythm, normal heart sounds and intact distal pulses.  Exam reveals no gallop and no friction rub.   No murmur heard. Pulmonary/Chest: Effort normal and breath sounds normal. No respiratory distress. She has no wheezes. She has no rales.  Abdominal: Soft. Bowel sounds are normal. She exhibits no distension. There is no tenderness. There is no rebound and no guarding.     Genitourinary:  Genitourinary Comments: Incontinent   Musculoskeletal: She exhibits no edema, tenderness or deformity.  Moves x 4 extremities.Bilateral lower extremities Ted hose in place.   Lymphadenopathy:    She has no cervical adenopathy.  Neurological: She is oriented to person, place, and time.  Skin: Skin is warm and dry. No rash noted. No erythema. No pallor.   Stage 3 Pressure ulcer: wound bed pink in color without any drainage.Surrounding skin without any signs of infections.   Psychiatric: She has a normal mood and affect.    Assessment/Plan Cough  Productive cough at times. Has taken mucinex.Will obtain portable CXR Pa/Lateral rule out pneumonia. Monitor vital signs every shift X 5 days.continue on mucinex.   Left leg pain  Current pain regimen ineffective. Will consult PMR for evaluation.  HTN  B/p stable. Continue on Norvasc and Coreg. Monitor BMP.  Neuropathy Continue on Gabapentin. Continue to control high risk factors.     Depression  Stable. Continue on Zoloft. Monitor for mood changes.   Sacral Pressure ulcer  Stage 3 wound bed pink without any signs of infections. Encourage oral intake. Continue protein supplements. Continue wound care.    Family/ staff Communication: Reviewed plan of care with patient and facility Nurse supervisor.   Labs/tests ordered: Portable CXR Pa/Lat rule out PNA

## 2016-11-04 ENCOUNTER — Encounter: Payer: Self-pay | Admitting: Family

## 2016-11-04 ENCOUNTER — Non-Acute Institutional Stay (SKILLED_NURSING_FACILITY): Payer: Medicare Other | Admitting: Family

## 2016-11-04 DIAGNOSIS — H6121 Impacted cerumen, right ear: Secondary | ICD-10-CM

## 2016-11-04 MED ORDER — CARBAMIDE PEROXIDE 6.5 % OT SOLN: 5.0000 [drp] | Freq: Two times a day (BID) | OTIC | 0 refills | Status: AC

## 2016-11-04 NOTE — Progress Notes (Signed)
Location:  [facility] Room Number: 203A Place of Service:  SNF [phone]) Provider:  Richarda Blade, FNP-C  Extended Emergency Contact Information Primary Emergency Contact: [facility] Address: [address]          [place], [place] [address] Darden [place] of [place] Phone: [phone] Relation: Daughter Secondary Emergency Contact: Wetzel [place] Mobile Phone: [phone] Relation: Daughter  Code Status:  Full Code  Goals of care: Advanced Directive information Advanced Directives 11/04/2016  Does Patient Have a Medical Advance Directive? No     Chief Complaint  Patient presents with  . Acute Visit    right ear popping    HPI:  Pt is a 71 y.o. female seen today at [facility] for an acute visit for evaluation of right ear popping. She denies any fever, chills, ringing, pain or drainage from the ear.    Past Medical History:  Diagnosis Date  . Acute deep vein thrombosis (DVT) of distal end of left lower extremity (HCC)   . Bilateral corneal abrasions   . Fusion of spine, cervical region   . Hypertension   . Injury to ligament of cervical spine 05/2016   Past Surgical History:  Procedure Laterality Date  . CORPECTOMY C6      Allergies  Allergen Reactions  . Ivp Dye [Iodinated Diagnostic Agents]   . Penicillins     Allergies as of 11/04/2016      Reactions   Ivp Dye [iodinated Diagnostic Agents]    Penicillins       Medication List       Accurate as of 11/04/16  3:04 PM. Always use your most recent med list.          acetaminophen 325 MG tablet Commonly known as:  TYLENOL Take 650 mg by mouth every 6 (six) hours as needed for mild pain.   allopurinol 100 MG tablet Commonly known as:  ZYLOPRIM Place 100 mg into feeding tube daily.   alum & mag hydroxide-simeth 400-400-40 MG/5ML suspension Commonly known as:  MAALOX PLUS Take 30 mLs by mouth every 6 (six) hours as needed  for indigestion.   amLODipine 10 MG tablet Commonly known as:  NORVASC Place 10 mg into feeding tube daily. Flush with  10 ml between meds   atorvastatin 40 MG tablet Commonly known as:  LIPITOR Place 40 mg into feeding tube at bedtime.   baclofen 20 MG tablet Commonly known as:  LIORESAL Take 20 mg by mouth every 8 (eight) hours.   bisacodyl 10 MG suppository Commonly known as:  DULCOLAX Place 10 mg rectally daily as needed for moderate constipation.   calcium carbonate 500 MG chewable tablet Commonly known as:  TUMS - dosed in mg elemental calcium Chew 1 tablet by mouth every 4 (four) hours as needed for indigestion or heartburn. Crush and mix with 10 cc water via peg   Carboxymethylcellulose Sod PF 0.25 % Soln Place 1 drop into both eyes 4 (four) times daily.   carvedilol 6.25 MG tablet Commonly known as:  COREG Place 6.25 mg into feeding tube 2 (two) times daily with a meal. Administer crushed   DECUBI-VITE PO Take 1 tablet by mouth daily.   DESOWEN 0.05 % lotion Generic drug:  desonide Apply 1 application topically 2 (two) times daily. Apply to peri-nasal/nose   diclofenac sodium 1 % Gel Commonly known as:  VOLTAREN Apply 2 g topically. To left lower leg   docusate  sodium 100 MG capsule Commonly known as:  COLACE Take 100 mg by mouth daily.   feeding supplement (PRO-STAT SUGAR FREE 64) Liqd Take 30 mLs by mouth 2 (two) times daily.   FIORICET/CODEINE 50-300-40-30 MG Caps Generic drug:  Butalbital-APAP-Caff-Cod Place 1 capsule into feeding tube every 4 (four) hours as needed. For headache. Flush with 10 ml between meds   gabapentin 400 MG capsule Commonly known as:  NEURONTIN Take 400 mg by mouth every 8 (eight) hours. Take along with Gabapentin 100 mg Tablet to equal 500 mg total   gabapentin 100 MG capsule Commonly known as:  NEURONTIN Take 100 mg by mouth every 8 (eight) hours. Take along with 400 mg Tablet to equal 500 mg total   guaiFENesin 100  MG/5ML liquid Commonly known as:  ROBITUSSIN Place 10 mLs into feeding tube every 4 (four) hours as needed for cough. Give 10 mL   HYDROcodone-acetaminophen 5-325 MG tablet Commonly known as:  NORCO/VICODIN Take 1 tablet by mouth every 4 (four) hours as needed. For pain   ipratropium-albuterol 0.5-2.5 (3) MG/3ML Soln Commonly known as:  DUONEB Take 3 mLs by nebulization every 6 (six) hours as needed.   meclizine 12.5 MG tablet Commonly known as:  ANTIVERT Place 12.5 mg into feeding tube 3 (three) times daily as needed for dizziness.   Melatonin 3 MG Tabs Take 3 mg by mouth at bedtime.   montelukast 10 MG tablet Commonly known as:  SINGULAIR Place 10 mg into feeding tube at bedtime.   nystatin powder Commonly known as:  MYCOSTATIN/NYSTOP Apply topically 2 (two) times daily. Apply to abdominal  skin fold fungal rash   omeprazole 40 MG capsule Commonly known as:  PRILOSEC Take 40 mg by mouth 2 (two) times daily.   rivaroxaban 20 MG Tabs tablet Commonly known as:  XARELTO 20 mg daily with supper. Crush and administer via PEG tube   senna 8.6 MG tablet Commonly known as:  SENOKOT Take 2 tablets by mouth every evening.   sertraline 25 MG tablet Commonly known as:  ZOLOFT Place 25 mg into feeding tube daily.       Review of Systems  Constitutional: Negative for activity change, appetite change, chills, fatigue and fever.  HENT: Negative for congestion, ear discharge, ear pain, rhinorrhea, sinus pressure, sneezing, sore throat and tinnitus.        Right ear popping and decreased hearing.   Eyes: Negative.   Respiratory: Negative for cough, choking, chest tightness, shortness of breath and wheezing.   Cardiovascular: Negative for chest pain, palpitations and leg swelling.  Gastrointestinal: Negative for abdominal distention, abdominal pain, blood in stool, constipation, diarrhea, nausea and vomiting.  Musculoskeletal: Negative for gait problem.  Neurological: Negative  for dizziness, seizures, syncope, light-headedness and headaches.  Psychiatric/Behavioral: Negative for agitation, confusion, hallucinations and sleep disturbance. The patient is not nervous/anxious.      There is no immunization history on file for this patient. Pertinent  Health Maintenance Due  Topic Date Due  . MAMMOGRAM  07/16/1994  . COLONOSCOPY  07/16/1994  . DEXA SCAN  07/16/2009  . INFLUENZA VACCINE  08/30/2017 (Originally 03/30/2016)  . PNA vac Low Risk Adult (1 of 2 - PCV13) 08/30/2017 (Originally 07/16/2009)    Vitals:   11/04/16 0954  BP: 128/61  Pulse: 71  Resp: 18  Temp: 97.6 F (36.4 C)  SpO2: 98%  Weight: 193 lb (87.5 kg)  Height: 5\' 2"  (1.575 m)   Body mass index is 35.3 kg/m. Physical Exam  Constitutional: She is oriented to person, place, and time. She appears well-nourished. No distress.  Morbid obese  HENT:  Head: Normocephalic.  Mouth/Throat: Oropharynx is clear and moist. No oropharyngeal exudate.  Right ear cerumen impaction. TM not visualized.   Eyes: Conjunctivae and EOM are normal. Pupils are equal, round, and reactive to light. Right eye exhibits no discharge. Left eye exhibits no discharge. No scleral icterus.  Neck: Normal range of motion. No JVD present. No thyromegaly present.  Cardiovascular: Normal rate, regular rhythm, normal heart sounds and intact distal pulses.  Exam reveals no gallop and no friction rub.   No murmur heard. Pulmonary/Chest: Effort normal and breath sounds normal. No respiratory distress. She has no wheezes. She has no rales.  Abdominal: Soft. Bowel sounds are normal. She exhibits no distension. There is no tenderness. There is no rebound and no guarding.     Genitourinary:  Genitourinary Comments: Incontinent   Musculoskeletal: She exhibits no edema, tenderness or deformity.  Moves x 4 extremities  Lymphadenopathy:    She has no cervical adenopathy.  Neurological: She is oriented to person, place, and time.  Skin:  Skin is warm and dry. No rash noted. No erythema. No pallor.   Stage 3 Pressure ulcer: wound bed pink in color.Surrounding skin without any signs of infections.   Psychiatric: She has a normal mood and affect.    Labs reviewed:  Recent Labs  07/15/16  NA 144  K 3.7  BUN 14  CREATININE 0.7    Recent Labs  07/15/16 08/24/16  AST 14 27  ALT 19 37*  ALKPHOS 88 88    Recent Labs  07/15/16  WBC 6.0  HGB 10.0*  HCT 31*  PLT 288   Assessment/Plan  Right ear Impacted cerumen  TM not visualized due to cerumen impaction. Start debrox 0.5 % instil 5 drops to right ear twice daily X 4 days then facility Nurse to  lavage with warm water.    Family/ staff Communication: Reviewed plan of care with patient and facility Nurse supervisor   Labs/tests ordered: None

## 2016-11-30 ENCOUNTER — Non-Acute Institutional Stay (SKILLED_NURSING_FACILITY): Payer: Medicare Other | Admitting: Internal Medicine

## 2016-11-30 ENCOUNTER — Encounter: Payer: Self-pay | Admitting: Internal Medicine

## 2016-11-30 DIAGNOSIS — E785 Hyperlipidemia, unspecified: Secondary | ICD-10-CM | POA: Diagnosis not present

## 2016-11-30 DIAGNOSIS — I10 Essential (primary) hypertension: Secondary | ICD-10-CM

## 2016-11-30 DIAGNOSIS — R21 Rash and other nonspecific skin eruption: Secondary | ICD-10-CM

## 2016-11-30 DIAGNOSIS — K5909 Other constipation: Secondary | ICD-10-CM

## 2016-11-30 DIAGNOSIS — J309 Allergic rhinitis, unspecified: Secondary | ICD-10-CM

## 2016-11-30 DIAGNOSIS — Z1211 Encounter for screening for malignant neoplasm of colon: Secondary | ICD-10-CM | POA: Diagnosis not present

## 2016-11-30 DIAGNOSIS — G959 Disease of spinal cord, unspecified: Secondary | ICD-10-CM | POA: Diagnosis not present

## 2016-11-30 DIAGNOSIS — K219 Gastro-esophageal reflux disease without esophagitis: Secondary | ICD-10-CM | POA: Diagnosis not present

## 2016-11-30 DIAGNOSIS — M62838 Other muscle spasm: Secondary | ICD-10-CM

## 2016-11-30 NOTE — Progress Notes (Signed)
LOCATION: [facility]  PCP: No primary care provider on file.   Code Status: Full Code  Goals of care: Advanced Directive information Advanced Directives 11/04/2016  Does Patient Have a Medical Advance Directive? No       Extended Emergency Contact Information Primary Emergency Contact: [facility] Address: [address]          [place], [place] [address] D[place] of [place] Phone: [phone] Relation: Daughter Secondary Emergency Contact: Wetzel [place] of [place] Mobile Phone: [phone] Relation: Daughter   Allergies  Allergen Reactions  . Ivp Dye [Iodinated Diagnostic Agents]   . Penicillins     Chief Complaint  Patient presents with  . Medical Management of Chronic Issues    Routine Visit      HPI:  Patient is a 71 y.o. female seen today for Routine visit. She complains of constipation this visit. On chart review her Colace was recently increased to 100 mg twice a day. She complains of congestion to her chest and sinuses for 5 days. 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 She has dry cough and also has postnasal drip. Denies any other concerns this visit. She is out of bed daily. She has been working with restorative therapy.  She is seen in her room today.   Review of Systems:  Constitutional: Negative for fever, chills, diaphoresis.  HENT: Negative for nasal discharge, sore throat, difficulty swallowing. Positive for occasional headaches.  Eyes: Negative for double vision and discharge.  Respiratory: Positive for wheezing. Negative for shortness of breath.   Cardiovascular: Negative for chest pain, palpitations, leg swelling.  Gastrointestinal: Negative for nausea, vomiting, abdominal pain, heartburn. Genitourinary: Negative for dysuria. Positive for urinary incontinence.   Musculoskeletal: Negative for back pain, fall in the facility.  Skin: Negative for itching, rash.  Neurological:  Positive for occasional dizziness with change of position.  Psychiatric/Behavioral: Negative for depression.   Past Medical History:  Diagnosis Date  . Acute deep vein thrombosis (DVT) of distal end of left lower extremity (HCC)   . Bilateral corneal abrasions   . Fusion of spine, cervical region   . Hypertension   . Injury to ligament of cervical spine 05/2016   Past Surgical History:  Procedure Laterality Date  . CORPECTOMY C6     Social History:   reports that she has never smoked. She has never used smokeless tobacco. Her alcohol and drug histories are not on file.  No family history on file.  Medications: Allergies as of 11/30/2016      Reactions   Ivp Dye [iodinated Diagnostic Agents]    Penicillins       Medication List       Accurate as of 11/30/16  1:24 PM. Always use your most recent med list.          acetaminophen 325 MG tablet Commonly known as:  TYLENOL Take 650 mg by mouth every 6 (six) hours as needed for mild pain.   allopurinol 100 MG tablet Commonly known as:  ZYLOPRIM Take 100 mg by mouth daily.   alum & mag hydroxide-simeth 400-400-40 MG/5ML suspension Commonly known as:  MAALOX PLUS Take 30 mLs by mouth every 6 (six) hours as needed for indigestion.   amLODipine 10 MG tablet Commonly known as:  NORVASC Take 10 mg by mouth daily.   atorvastatin 40 MG tablet Commonly known as:  LIPITOR Take 40 mg by mouth at bedtime.   baclofen 20 MG  tablet Commonly known as:  LIORESAL Take 20 mg by mouth every 8 (eight) hours.   bisacodyl 10 MG suppository Commonly known as:  DULCOLAX Place 10 mg rectally daily as needed for moderate constipation.   calcium carbonate 500 MG chewable tablet Commonly known as:  TUMS - dosed in mg elemental calcium Chew 1 tablet by mouth every 4 (four) hours as needed for indigestion or heartburn.   Carboxymethylcellulose Sod PF 0.25 % Soln Place 1 drop into both eyes 4 (four) times daily.   carvedilol 6.25 MG  tablet Commonly known as:  COREG Take 6.25 mg by mouth 2 (two) times daily with a meal. Administer crushed   DECUBI-VITE PO Take 1 tablet by mouth daily.   DESOWEN 0.05 % lotion Generic drug:  desonide Apply 1 application topically 2 (two) times daily. Apply to peri-nasal/nose   diclofenac sodium 1 % Gel Commonly known as:  VOLTAREN Apply 2 g topically 4 (four) times daily. To left lower leg   docusate sodium 100 MG capsule Commonly known as:  COLACE Take 100 mg by mouth 2 (two) times daily.   feeding supplement (PRO-STAT SUGAR FREE 64) Liqd Take 30 mLs by mouth 2 (two) times daily.   FIORICET/CODEINE 50-300-40-30 MG Caps Generic drug:  Butalbital-APAP-Caff-Cod Take 1 capsule by mouth every 4 (four) hours as needed.   gabapentin 400 MG capsule Commonly known as:  NEURONTIN Take 400 mg by mouth every 8 (eight) hours. Take along with Gabapentin 100 mg Tablet to equal 500 mg total   gabapentin 100 MG capsule Commonly known as:  NEURONTIN Take 100 mg by mouth every 8 (eight) hours. Take along with 400 mg Tablet to equal 500 mg total   guaiFENesin 100 MG/5ML liquid Commonly known as:  ROBITUSSIN Take 10 mLs by mouth every 4 (four) hours as needed for cough. Give 10 mL   HYDROcodone-acetaminophen 5-325 MG tablet Commonly known as:  NORCO/VICODIN Take 1 tablet by mouth every 4 (four) hours as needed. For pain   ipratropium-albuterol 0.5-2.5 (3) MG/3ML Soln Commonly known as:  DUONEB Take 3 mLs by nebulization every 6 (six) hours as needed.   meclizine 12.5 MG tablet Commonly known as:  ANTIVERT Take 12.5 mg by mouth 3 (three) times daily as needed for dizziness.   Melatonin 3 MG Tabs Take 3 mg by mouth at bedtime.   montelukast 10 MG tablet Commonly known as:  SINGULAIR Take 10 mg by mouth at bedtime.   nystatin powder Commonly known as:  MYCOSTATIN/NYSTOP Apply topically 2 (two) times daily. Apply to abdominal  skin fold fungal rash   omeprazole 40 MG  capsule Commonly known as:  PRILOSEC Take 40 mg by mouth 2 (two) times daily.   rivaroxaban 20 MG Tabs tablet Commonly known as:  XARELTO Take 20 mg by mouth daily with supper.   senna 8.6 MG tablet Commonly known as:  SENOKOT Take 2 tablets by mouth every evening.   sertraline 25 MG tablet Commonly known as:  ZOLOFT Take 25 mg by mouth daily.       Immunizations:  There is no immunization history on file for this patient.   Physical Exam: Vitals:   11/30/16 1314  BP: (!) 119/59  Pulse: 81  Resp: 18  SpO2: 94%  Weight: 193 lb (87.5 kg)  Height:  (1.575 m)   Body mass index is 35.3 kg/m.  General- elderly female, Obese, in no acute distress Head- normocephalic, atraumatic Nose- no maxillary or frontal sinus tenderness, no  nasal discharge Throat- moist mucus membrane , normal oropharynx, missing teeth Eyes- PERRLA, EOMI, no pallor, no icterus, no discharge, normal conjunctiva, normal sclera Neck- no cervical lymphadenopathy Cardiovascular- normal s1,s2, no murmur Respiratory- bilateral clear to auscultation, + wheeze, no rhonchi, no crackles, no use of accessory muscles Abdomen- bowel sounds present, soft, non tender Musculoskeletal- trace leg edema, generalized weakness greater to left side, increased tone with spasticity to left lower extremity, left had and digit contracture present Neurological- alert and oriented to person, place and time Skin- warm and dry, stage III pressure ulcer to sacrum Psychiatry- normal mood and affect    Labs reviewed: Basic Metabolic Panel:  Recent Labs  16/10/96  NA 144  K 3.7  BUN 14  CREATININE 0.7   Liver Function Tests:  Recent Labs  07/15/16 08/24/16  AST 14 27  ALT 19 37*  ALKPHOS 88 88   No results for input(s): LIPASE, AMYLASE in the last 8760 hours. No results for input(s): AMMONIA in the last 8760 hours. CBC:  Recent Labs  07/15/16  WBC 6.0  HGB 10.0*  HCT 31*  PLT 288       Assessment/Plan   Allergic rhinitis Pt has increased congestion to her sinuses and chest at present. Wheezing on exam. No signs of acute sinusitis on exam. Likely has flare up of her allergies. Change her prn duoneb to qid x 1 week. Start loratidine 10 mg daily x 2 weeks and monitor. Continue ehr singulair.   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 Continue baclofen 20 mg tid and monitor.   gerd Stable, denies heartburn, decrease omeprazole to 20 mg bid from 40 mg bid and monitor  Essential HTN Overall stable BP reading on review. Continue coreg 6.25 mg bid and amlodipine 10 mg daily  Chronic constipation On colace 100 mg bid and senokot qhs. Add miralax daily and monitor. Continue prn dulcolax suppository.  Cervical myelopathy with quadriparesis Working with OT at present. OOB daily. Also followed by PMR for spasticity and pain. Continue prn norco and scheduled baclofen  Colon cancer screening Has constipation. Given her age, Check FOBT X 3  Hyperlipidemia Lipid Panel  No results found for: CHOL, TRIG, HDL, CHOLHDL, VLDL, LDLCALC, LDLDIRECT Check lipid panel. Continue lipitor 40 mg daily  Groin rash With fungal dermatitis. Continue nystatin powder bid after cleaning the area for now.  Stage 3 PU sacrum To be followed by wound care. Pressure ulcer prophylaxis. Continue decubivite.   Left lower extremity DVT Continue Xarelto 20 mg daily for at least 6 months- until 01/2017 and re-assess her risk factor to review further treatment plan.    Labs/tests ordered: CBC, CMP, lipid panel 12/06/16  Family/ staff Communication: reviewed care plan with patient and nursing supervisor  I have spent greater than 35 minutes for this encounter which includes reviewing medications and lab work, imaging, consults, addressing above mentioned concerns, reviewing care plan with patient, answering patient's concerns.    Oneal Grout, MD Internal Medicine [facility] [address] [place], [place] [address] Cell Phone (Monday-Friday 8 am - 5 pm): [phone] On Call: [phone] and follow prompts after 5 pm and on weekends Office Phone: [phone] Office Fax: [phone]

## 2016-12-06 LAB — CBC AND DIFFERENTIAL
HEMATOCRIT: 32 % — AB (ref 36–46)
Hemoglobin: 10.4 g/dL — AB (ref 12.0–16.0)
PLATELETS: 225 10*3/uL (ref 150–399)
WBC: 6.6 10*3/mL

## 2016-12-06 LAB — BASIC METABOLIC PANEL
BUN: 18 mg/dL (ref 4–21)
Creatinine: 0.7 mg/dL (ref 0.5–1.1)
Glucose: 81 mg/dL
Potassium: 3.9 mmol/L (ref 3.4–5.3)
Sodium: 144 mmol/L (ref 137–147)

## 2016-12-06 LAB — LIPID PANEL
CHOLESTEROL: 200 mg/dL (ref 0–200)
HDL: 37 mg/dL (ref 35–70)
LDL CALC: 148 mg/dL
TRIGLYCERIDES: 74 mg/dL (ref 40–160)

## 2016-12-29 ENCOUNTER — Non-Acute Institutional Stay (SKILLED_NURSING_FACILITY): Payer: Medicare Other | Admitting: Family

## 2016-12-29 ENCOUNTER — Encounter: Payer: Self-pay | Admitting: Family

## 2016-12-29 DIAGNOSIS — E669 Obesity, unspecified: Secondary | ICD-10-CM

## 2016-12-29 DIAGNOSIS — F32A Depression, unspecified: Secondary | ICD-10-CM

## 2016-12-29 DIAGNOSIS — E782 Mixed hyperlipidemia: Secondary | ICD-10-CM | POA: Diagnosis not present

## 2016-12-29 DIAGNOSIS — I10 Essential (primary) hypertension: Secondary | ICD-10-CM | POA: Diagnosis not present

## 2016-12-29 DIAGNOSIS — F329 Major depressive disorder, single episode, unspecified: Secondary | ICD-10-CM

## 2016-12-29 NOTE — Progress Notes (Signed)
Location:  [facility] Room Number: 203A Place of Service:  SNF [phone]) Provider: Richarda Blade FNP-C   No care team member to display  Extended Emergency Contact Information Primary Emergency Contact: [facility] Address: [address]          [place], [place] [address] D[place] of [place] Phone: [phone] Relation: Daughter Secondary Emergency Contact: Wetzel [place] Mobile Phone: [phone] Relation: Daughter  Code Status:  Full Code  Goals of care: Advanced Directive information Advanced Directives 12/29/2016  Does Patient Have a Medical Advance Directive? No     Chief Complaint  Patient presents with  . Medical Management of Chronic Issues    Routine visit    HPI:  Pt is a 71 y.o. female seen today at [facility] for medical management of chronic issues.She has a medical history of HTN, Hyperlipidemia, GERD, depression, Neuropathy, Gout among other conditions. She is seen in her room today.She complains of left worsening cough. She states productive at times. Also complains of left leg pain not relieved by current pain regimen.she denies any fever or chills. She has a stage 3 Pressure ulcer to sacral area managed by wound care nurse. She has had a 8.8 lbs weight loss over three months. She is currently following up with Registered dietician for protein supplements.   Past Medical History:  Diagnosis Date  . Acute deep vein thrombosis (DVT) of distal end of left lower extremity (HCC)   . Bilateral corneal abrasions   . Fusion of spine, cervical region   . Hypertension   . Injury to ligament of cervical spine 05/2016   Past Surgical History:  Procedure Laterality Date  . CORPECTOMY C6      Allergies  Allergen Reactions  . Ivp Dye [Iodinated Diagnostic Agents]   . Penicillins     Allergies as of 12/29/2016      Reactions   Ivp Dye [iodinated Diagnostic Agents]    Penicillins        Medication List       Accurate as of 12/29/16  3:37 PM. Always use your most recent med list.          acetaminophen 325 MG tablet Commonly known as:  TYLENOL Take 650 mg by mouth every 6 (six) hours as needed for mild pain.   allopurinol 100 MG tablet Commonly known as:  ZYLOPRIM Take 100 mg by mouth daily.   alum & mag hydroxide-simeth 400-400-40 MG/5ML suspension Commonly known as:  MAALOX PLUS Take 30 mLs by mouth every 6 (six) hours as needed for indigestion.   amLODipine 10 MG tablet Commonly known as:  NORVASC Take 10 mg by mouth daily.   atorvastatin 40 MG tablet Commonly known as:  LIPITOR Take 40 mg by mouth at bedtime.   baclofen 20 MG tablet Commonly known as:  LIORESAL Take 20 mg by mouth every 8 (eight) hours.   bisacodyl 10 MG suppository Commonly known as:  DULCOLAX Place 10 mg rectally daily as needed for moderate constipation.   calcium carbonate 500 MG chewable tablet Commonly known as:  TUMS - dosed in mg elemental calcium Chew 1 tablet by mouth every 4 (four) hours as needed for indigestion or heartburn.   Carboxymethylcellulose Sod PF 0.25 % Soln Place 1 drop into both eyes 4 (four) times daily.   carvedilol 6.25 MG tablet Commonly known as:  COREG Take 6.25 mg by mouth 2 (two) times daily with a  meal. Administer crushed   DECUBI-VITE PO Take 1 tablet by mouth daily.   DESOWEN 0.05 % lotion Generic drug:  desonide Apply 1 application topically 2 (two) times daily. Apply to peri-nasal/nose   diclofenac sodium 1 % Gel Commonly known as:  VOLTAREN Apply 2 g topically 4 (four) times daily. To left lower leg   docusate sodium 100 MG capsule Commonly known as:  COLACE Take 100 mg by mouth 2 (two) times daily.   feeding supplement (PRO-STAT SUGAR FREE 64) Liqd Take 30 mLs by mouth 2 (two) times daily.   FIORICET/CODEINE 50-300-40-30 MG Caps Generic drug:  Butalbital-APAP-Caff-Cod Take 1 capsule by mouth every 4 (four) hours as  needed.   gabapentin 400 MG capsule Commonly known as:  NEURONTIN Take 400 mg by mouth every 8 (eight) hours. Take along with Gabapentin 100 mg Tablet to equal 500 mg total   gabapentin 100 MG capsule Commonly known as:  NEURONTIN Take 100 mg by mouth every 8 (eight) hours. Take along with 400 mg Tablet to equal 500 mg total   guaiFENesin 100 MG/5ML liquid Commonly known as:  ROBITUSSIN Take 10 mLs by mouth every 4 (four) hours as needed for cough. Give 10 mL   HYDROcodone-acetaminophen 5-325 MG tablet Commonly known as:  NORCO/VICODIN Take 1 tablet by mouth every 4 (four) hours as needed. For pain   ipratropium-albuterol 0.5-2.5 (3) MG/3ML Soln Commonly known as:  DUONEB Take 3 mLs by nebulization every 6 (six) hours as needed.   meclizine 12.5 MG tablet Commonly known as:  ANTIVERT Take 12.5 mg by mouth 3 (three) times daily as needed for dizziness.   Melatonin 3 MG Tabs Take 3 mg by mouth at bedtime.   montelukast 10 MG tablet Commonly known as:  SINGULAIR Take 10 mg by mouth at bedtime.   nystatin powder Commonly known as:  MYCOSTATIN/NYSTOP Apply topically 2 (two) times daily. Apply to abdominal  skin fold fungal rash   omeprazole 40 MG capsule Commonly known as:  PRILOSEC Take 40 mg by mouth 2 (two) times daily.   rivaroxaban 20 MG Tabs tablet Commonly known as:  XARELTO Take 20 mg by mouth daily with supper.   senna 8.6 MG tablet Commonly known as:  SENOKOT Take 1 tablet by mouth every evening.   sertraline 25 MG tablet Commonly known as:  ZOLOFT Take 25 mg by mouth daily.       Review of Systems  Constitutional: Negative for activity change, appetite change, chills, fatigue and fever.  HENT: Negative for congestion, rhinorrhea, sinus pressure, sneezing and sore throat.   Eyes: Negative.   Respiratory: Negative for cough, choking, chest tightness, shortness of breath and wheezing.   Cardiovascular: Negative for chest pain, palpitations and leg  swelling.  Gastrointestinal: Negative for abdominal distention, abdominal pain, blood in stool, constipation, diarrhea, nausea and vomiting.  Endocrine: Negative.   Genitourinary: Negative for difficulty urinating, dysuria, frequency and urgency.  Musculoskeletal: Positive for gait problem.       Left leg pain   Skin: Negative for color change, pallor and rash.  Neurological: Negative for dizziness, seizures, syncope, light-headedness and headaches.  Hematological: Does not bruise/bleed easily.  Psychiatric/Behavioral: Negative for agitation, confusion, hallucinations and sleep disturbance. The patient is not nervous/anxious.      There is no immunization history on file for this patient. Pertinent  Health Maintenance Due  Topic Date Due  . HEMOGLOBIN A1C  07-18-1946  . FOOT EXAM  07/16/1954  . OPHTHALMOLOGY EXAM  07/16/1954  .  URINE MICROALBUMIN  07/16/1954  . INFLUENZA VACCINE  08/30/2017 (Originally 03/30/2017)  . PNA vac Low Risk Adult (1 of 2 - PCV13) 08/30/2017 (Originally 07/16/2009)  . MAMMOGRAM  12/29/2017 (Originally 07/16/1994)  . COLONOSCOPY  12/29/2017 (Originally 07/16/1994)  . DEXA SCAN  12/30/2018 (Originally 07/16/2009)    Vitals:   12/29/16 1348  BP: (!) 153/83  Pulse: 60  Resp: 18  Temp: 97 F (36.1 C)  SpO2: 94%  Weight: 196 lb 6 oz (89.1 kg)  Height:  (1.575 m)   Body mass index is 35.92 kg/m. Physical Exam  Constitutional: She is oriented to person, place, and time. She appears well-nourished. No distress.  Morbid obese elderly   HENT:  Head: Normocephalic.  Mouth/Throat: Oropharynx is clear and moist. No oropharyngeal exudate.  Eyes: Conjunctivae and EOM are normal. Pupils are equal, round, and reactive to light. Right eye exhibits no discharge. Left eye exhibits no discharge. No scleral icterus.  Neck: Normal range of motion. No JVD present. No thyromegaly present.  Cardiovascular: Normal rate, regular rhythm, normal heart sounds and intact  distal pulses.  Exam reveals no gallop and no friction rub.   No murmur heard. Pulmonary/Chest: Effort normal and breath sounds normal. No respiratory distress. She has no wheezes. She has no rales.  Abdominal: Soft. Bowel sounds are normal. She exhibits no distension. There is no tenderness. There is no rebound and no guarding.     Genitourinary:  Genitourinary Comments: Incontinent for both bladder and bowel   Musculoskeletal: She exhibits no edema, tenderness or deformity.  Moves x 4 extremities.Bilateral lower extremities.  Lymphadenopathy:    She has no cervical adenopathy.  Neurological: She is oriented to person, place, and time.  Skin: Skin is warm and dry. No rash noted. No erythema. No pallor.  Psychiatric: She has a normal mood and affect.    Assessment/Plan  HTN  B/p stable.Continue on Norvasc and Coreg. Monitor BMP.  Hyperlipidemia  Continue on atorvastatin 40 mg Tablet.monitor lipid panel periodically.      Depression  Stable. Continue on Zoloft. Monitor for mood changes.will check TSH level 01/03/2017   Obesity  BMI 35.92 continue on heart healthy diet.will check Hgb A1C 01/03/2017  Family/ staff Communication: Reviewed plan of care with patient and facility Nurse supervisor.   Labs/tests ordered: Hgb A1C and TSH level 01/03/2017

## 2017-01-03 LAB — TSH: TSH: 2.082

## 2017-01-03 LAB — HEMOGLOBIN A1C: A1c: 5.6

## 2017-01-04 ENCOUNTER — Encounter: Payer: Self-pay | Admitting: *Deleted

## 2017-01-17 ENCOUNTER — Non-Acute Institutional Stay (SKILLED_NURSING_FACILITY): Payer: Medicare Other

## 2017-01-17 DIAGNOSIS — Z0000 Encounter for general adult medical examination without abnormal findings: Secondary | ICD-10-CM | POA: Diagnosis not present

## 2017-01-17 NOTE — Patient Instructions (Signed)
Brandy Edwards , Thank you for taking time to come for your Medicare Wellness Visit. I appreciate your ongoing commitment to your health goals. Please review the following plan we discussed and let me know if I can assist you in the future.   Screening recommendations/referrals: Colonoscopy long term pt Mammogram long term pt Bone Density due. Recommended yearly ophthalmology/optometry visit for glaucoma screening and checkup Recommended yearly dental visit for hygiene and checkup  Vaccinations: Influenza vaccine declined Pneumococcal vaccine declined Tdap vaccine not in records Shingles vaccine not in records  Advanced directives: Need copy  Conditions/risks identified: None  Next appointment: none upcoming   Preventive Care 65 Years and Older, Female Preventive care refers to lifestyle choices and visits with your health care provider that can promote health and wellness. What does preventive care include?  A yearly physical exam. This is also called an annual well check.  Dental exams once or twice a year.  Routine eye exams. Ask your health care provider how often you should have your eyes checked.  Personal lifestyle choices, including:  Daily care of your teeth and gums.  Regular physical activity.  Eating a healthy diet.  Avoiding tobacco and drug use.  Limiting alcohol use.  Practicing safe sex.  Taking low-dose aspirin every day.  Taking vitamin and mineral supplements as recommended by your health care provider. What happens during an annual well check? The services and screenings done by your health care provider during your annual well check will depend on your age, overall health, lifestyle risk factors, and family history of disease. Counseling  Your health care provider may ask you questions about your:  Alcohol use.  Tobacco use.  Drug use.  Emotional well-being.  Home and relationship well-being.  Sexual activity.  Eating  habits.  History of falls.  Memory and ability to understand (cognition).  Work and work Astronomer.  Reproductive health. Screening  You may have the following tests or measurements:  Height, weight, and BMI.  Blood pressure.  Lipid and cholesterol levels. These may be checked every 5 years, or more frequently if you are over 68 years old.  Skin check.  Lung cancer screening. You may have this screening every year starting at age 31 if you have a 30-pack-year history of smoking and currently smoke or have quit within the past 15 years.  Fecal occult blood test (FOBT) of the stool. You may have this test every year starting at age 84.  Flexible sigmoidoscopy or colonoscopy. You may have a sigmoidoscopy every 5 years or a colonoscopy every 10 years starting at age 58.  Hepatitis C blood test.  Hepatitis B blood test.  Sexually transmitted disease (STD) testing.  Diabetes screening. This is done by checking your blood sugar (glucose) after you have not eaten for a while (fasting). You may have this done every 1-3 years.  Bone density scan. This is done to screen for osteoporosis. You may have this done starting at age 62.  Mammogram. This may be done every 1-2 years. Talk to your health care provider about how often you should have regular mammograms. Talk with your health care provider about your test results, treatment options, and if necessary, the need for more tests. Vaccines  Your health care provider may recommend certain vaccines, such as:  Influenza vaccine. This is recommended every year.  Tetanus, diphtheria, and acellular pertussis (Tdap, Td) vaccine. You may need a Td booster every 10 years.  Zoster vaccine. You may need this after age  60.  Pneumococcal 13-valent conjugate (PCV13) vaccine. One dose is recommended after age 24.  Pneumococcal polysaccharide (PPSV23) vaccine. One dose is recommended after age 66. Talk to your health care provider about which  screenings and vaccines you need and how often you need them. This information is not intended to replace advice given to you by your health care provider. Make sure you discuss any questions you have with your health care provider. Document Released: 09/12/2015 Document Revised: 05/05/2016 Document Reviewed: 06/17/2015 Elsevier Interactive Patient Education  2017 [place] Prevention in the Home Falls can cause injuries. They can happen to people of all ages. There are many things you can do to make your home safe and to help prevent falls. What can I do on the outside of my home?  Regularly fix the edges of walkways and driveways and fix any cracks.  Remove anything that might make you trip as you walk through a door, such as a raised step or threshold.  Trim any bushes or trees on the path to your home.  Use bright outdoor lighting.  Clear any walking paths of anything that might make someone trip, such as rocks or tools.  Regularly check to see if handrails are loose or broken. Make sure that both sides of any steps have handrails.  Any raised decks and porches should have guardrails on the edges.  Have any leaves, snow, or ice cleared regularly.  Use sand or salt on walking paths during winter.  Clean up any spills in your garage right away. This includes oil or grease spills. What can I do in the bathroom?  Use night lights.  Install grab bars by the toilet and in the tub and shower. Do not use towel bars as grab bars.  Use non-skid mats or decals in the tub or shower.  If you need to sit down in the shower, use a plastic, non-slip stool.  Keep the floor dry. Clean up any water that spills on the floor as soon as it happens.  Remove soap buildup in the tub or shower regularly.  Attach bath mats securely with double-sided non-slip rug tape.  Do not have throw rugs and other things on the floor that can make you trip. What can I do in the bedroom?  Use  night lights.  Make sure that you have a light by your bed that is easy to reach.  Do not use any sheets or blankets that are too big for your bed. They should not hang down onto the floor.  Have a firm chair that has side arms. You can use this for support while you get dressed.  Do not have throw rugs and other things on the floor that can make you trip. What can I do in the kitchen?  Clean up any spills right away.  Avoid walking on wet floors.  Keep items that you use a lot in easy-to-reach places.  If you need to reach something above you, use a strong step stool that has a grab bar.  Keep electrical cords out of the way.  Do not use floor polish or wax that makes floors slippery. If you must use wax, use non-skid floor wax.  Do not have throw rugs and other things on the floor that can make you trip. What can I do with my stairs?  Do not leave any items on the stairs.  Make sure that there are handrails on both sides of the stairs and  use them. Fix handrails that are broken or loose. Make sure that handrails are as long as the stairways.  Check any carpeting to make sure that it is firmly attached to the stairs. Fix any carpet that is loose or worn.  Avoid having throw rugs at the top or bottom of the stairs. If you do have throw rugs, attach them to the floor with carpet tape.  Make sure that you have a light switch at the top of the stairs and the bottom of the stairs. If you do not have them, ask someone to add them for you. What else can I do to help prevent falls?  Wear shoes that:  Do not have high heels.  Have rubber bottoms.  Are comfortable and fit you well.  Are closed at the toe. Do not wear sandals.  If you use a stepladder:  Make sure that it is fully opened. Do not climb a closed stepladder.  Make sure that both sides of the stepladder are locked into place.  Ask someone to hold it for you, if possible.  Clearly mark and make sure that you  can see:  Any grab bars or handrails.  First and last steps.  Where the edge of each step is.  Use tools that help you move around (mobility aids) if they are needed. These include:  Canes.  Walkers.  Scooters.  Crutches.  Turn on the lights when you go into a dark area. Replace any light bulbs as soon as they burn out.  Set up your furniture so you have a clear path. Avoid moving your furniture around.  If any of your floors are uneven, fix them.  If there are any pets around you, be aware of where they are.  Review your medicines with your doctor. Some medicines can make you feel dizzy. This can increase your chance of falling. Ask your doctor what other things that you can do to help prevent falls. This information is not intended to replace advice given to you by your health care provider. Make sure you discuss any questions you have with your health care provider. Document Released: 06/12/2009 Document Revised: 01/22/2016 Document Reviewed: 09/20/2014 Elsevier Interactive Patient Education  2017 [employer]

## 2017-01-17 NOTE — Progress Notes (Signed)
Subjective:   Brandy Edwards is a 71 y.o. female who presents for an Initial Medicare Annual Wellness Visit at [facility]    Objective:    Today's Vitals   01/17/17 1613  BP: 120/81  Pulse: 99  Temp: 99 F (37.2 C)  TempSrc: Oral  SpO2: 92%  Weight: 196 lb (88.9 kg)  Height: 5\' 2"  (1.575 m)   Body mass index is 35.85 kg/m.   Current Medications (verified) Outpatient Encounter Prescriptions as of 01/17/2017  Medication Sig  . acetaminophen (TYLENOL) 325 MG tablet Take 650 mg by mouth every 6 (six) hours as needed for mild pain.  Marland Kitchen allopurinol (ZYLOPRIM) 100 MG tablet Take 100 mg by mouth daily.   Marland Kitchen alum & mag hydroxide-simeth (MAALOX PLUS) 400-400-40 MG/5ML suspension Take 30 mLs by mouth every 6 (six) hours as needed for indigestion.   . Amino Acids-Protein Hydrolys (FEEDING SUPPLEMENT, PRO-STAT SUGAR FREE 64,) LIQD Take 30 mLs by mouth 2 (two) times daily.  Marland Kitchen amLODipine (NORVASC) 10 MG tablet Take 10 mg by mouth daily.   Marland Kitchen atorvastatin (LIPITOR) 40 MG tablet Take 40 mg by mouth at bedtime.   . baclofen (LIORESAL) 20 MG tablet Take 20 mg by mouth every 8 (eight) hours.  . bisacodyl (DULCOLAX) 10 MG suppository Place 10 mg rectally daily as needed for moderate constipation.   . Butalbital-APAP-Caff-Cod (FIORICET/CODEINE) 50-300-40-30 MG CAPS Take 1 capsule by mouth every 4 (four) hours as needed.   . calcium carbonate (TUMS - DOSED IN MG ELEMENTAL CALCIUM) 500 MG chewable tablet Chew 1 tablet by mouth every 4 (four) hours as needed for indigestion or heartburn.   . Carboxymethylcellulose Sod PF 0.25 % SOLN Place 1 drop into both eyes 4 (four) times daily.   . carvedilol (COREG) 6.25 MG tablet Take 6.25 mg by mouth 2 (two) times daily with a meal. Administer crushed  . desonide (DESOWEN) 0.05 % lotion Apply 1 application topically 2 (two) times daily. Apply to peri-nasal/nose  . diclofenac sodium (VOLTAREN) 1 % GEL Apply 2 g topically 4 (four) times daily. To left  lower leg  . docusate sodium (COLACE) 100 MG capsule Take 100 mg by mouth 2 (two) times daily.   Marland Kitchen gabapentin (NEURONTIN) 100 MG capsule Take 100 mg by mouth every 8 (eight) hours. Take along with 400 mg Tablet to equal 500 mg total  . gabapentin (NEURONTIN) 400 MG capsule Take 400 mg by mouth every 8 (eight) hours. Take along with Gabapentin 100 mg Tablet to equal 500 mg total  . guaiFENesin (ROBITUSSIN) 100 MG/5ML liquid Take 10 mLs by mouth every 4 (four) hours as needed for cough. Give 10 mL   . HYDROcodone-acetaminophen (NORCO/VICODIN) 5-325 MG tablet Take 1 tablet by mouth every 4 (four) hours as needed. For pain  . ipratropium-albuterol (DUONEB) 0.5-2.5 (3) MG/3ML SOLN Take 3 mLs by nebulization every 6 (six) hours as needed.  . meclizine (ANTIVERT) 12.5 MG tablet Take 12.5 mg by mouth 3 (three) times daily as needed for dizziness.   . Melatonin 3 MG TABS Take 3 mg by mouth at bedtime.  . montelukast (SINGULAIR) 10 MG tablet Take 10 mg by mouth at bedtime.   . Multiple Vitamins-Minerals (DECUBI-VITE PO) Take 1 tablet by mouth daily.   Marland Kitchen nystatin (MYCOSTATIN/NYSTOP) powder Apply topically 2 (two) times daily. Apply to abdominal  skin fold fungal rash  . omeprazole (PRILOSEC) 40 MG capsule Take 40 mg by mouth 2 (two) times daily.   Marland Kitchen  rivaroxaban (XARELTO) 20 MG TABS tablet Take 20 mg by mouth daily with supper.   . senna (SENOKOT) 8.6 MG tablet Take 1 tablet by mouth every evening.   . sertraline (ZOLOFT) 25 MG tablet Take 25 mg by mouth daily.    No facility-administered encounter medications on file as of 01/17/2017.     Allergies (verified) Ivp dye [iodinated diagnostic agents] and Penicillins   History: Past Medical History:  Diagnosis Date  . Acute deep vein thrombosis (DVT) of distal end of left lower extremity (HCC)   . Bilateral corneal abrasions   . Fusion of spine, cervical region   . Hypertension   . 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 CORPECTOMY C6     History reviewed. No pertinent family history. Social History   Occupational History  . Not on file.   Social History Main Topics  . Smoking status: Never Smoker  . Smokeless tobacco: Never Used  . Alcohol use No  . Drug use: No  . Sexual activity: Not on file    Tobacco Counseling Counseling given: Not Answered   Activities of Daily Living In your present state of health, do you have any difficulty performing the following activities: 01/17/2017  Hearing? N  Vision? N  Difficulty concentrating or making decisions? Y  Walking or climbing stairs? Y  Dressing or bathing? Y  Doing errands, shopping? Y  Preparing Food and eating ? Y  Using the Toilet? Y  In the past six months, have you accidently leaked urine? Y  Do you have problems with loss of bowel control? Y  Managing your Medications? Y  Managing your Finances? Y  Housekeeping or managing your Housekeeping? Y    Immunizations and Health Maintenance  There is no immunization history on file for this patient. Health Maintenance Due  Topic Date Due  . FOOT EXAM  07/16/1956  . OPHTHALMOLOGY EXAM  07/16/1956  . URINE MICROALBUMIN  [birth year]16/1956    No care team member to display  Indicate any recent Medical Services you may have received from other than [facility] providers in the past year (date may be approximate).     Assessment:   This is a routine wellness examination for Brandy Edwards.   Hearing/Vision screen Vision Screening Comments: Has been requesting to see the eye doctor at [facility]   Dietary issues and exercise activities discussed: Current Exercise Habits: The patient does not participate in regular exercise at present, Exercise limited by: orthopedic condition(s) (no use of legs)  Goals    . Maintain Lifestyle          Starting today pt will maintain lifestyle.       Depression Screen PHQ 2/9 Scores 01/17/2017  PHQ - 2 Score 1    Fall Risk Fall  Risk  01/17/2017  Falls in the past year? Yes  Number falls in past yr: 2 or more  Injury with Fall? No    Cognitive Function:     6CIT Screen 01/17/2017  What Year? 0 points  What month? 0 points  What time? 0 points  Count back from 20 0 points  Months in reverse 0 points  Repeat phrase 0 points  Total Score 0    Screening Tests Health Maintenance  Topic Date Due  . FOOT EXAM  07/16/1956  . OPHTHALMOLOGY EXAM  [birth year]16/1956  . URINE MICROALBUMIN  [birth year]16/1956  . INFLUENZA VACCINE  08/30/2017 (Originally 03/30/2017)  .  TETANUS/TDAP  08/30/2017 (Originally 07/17/1963)  . PNA vac Low Risk Adult (1 of 2 - PCV13) 08/30/2017 (Originally 07/16/2009)  . MAMMOGRAM  12/29/2017 (Originally 07/16/1994)  . COLONOSCOPY  12/29/2017 (Originally 07/16/1994)  . DEXA SCAN  12/30/2018 (Originally 07/16/2009)  . Hepatitis C Screening  08/31/2023 (Originally [birth year])  . HEMOGLOBIN A1C  07/06/2017      Plan:      I have personally reviewed and addressed the Medicare Annual Wellness questionnaire and have noted the following in the patient's chart:  A. Medical and social history B. Use of alcohol, tobacco or illicit drugs  C. Current medications and supplements D. Functional ability and status E.  Nutritional status F.  Physical activity G. Advance directives H. List of other physicians I.  Hospitalizations, surgeries, and ER visits in previous 12 months J.  Vitals K. Screenings to include hearing, vision, cognitive, depression L. Referrals and appointments - none  In addition, I have reviewed and discussed with patient certain preventive protocols, quality metrics, and best practice recommendations. A written personalized care plan for preventive services as well as general preventive health recommendations were provided to patient.  See attached scanned questionnaire for additional information.   Signed,   Annetta Maw, RN Nurse Health Advisor   Quick Notes   Health  Maintenance: Pt requesting eye exam at [facility], can you put in referral? DEXA due, let me know if you think pt needs this.     Abnormal Screen: 6 CIT-0     Patient Concerns: Need of glasses and dentures    Nurse Concerns: None

## 2017-09-06 ENCOUNTER — Other Ambulatory Visit: Payer: Self-pay | Admitting: Nurse Practitioner

## 2017-09-06 DIAGNOSIS — M792 Neuralgia and neuritis, unspecified: Secondary | ICD-10-CM

## 2017-09-13 ENCOUNTER — Ambulatory Visit
Admission: RE | Admit: 2017-09-13 | Discharge: 2017-09-13 | Disposition: A | Discharge status: Home / Self Care | Payer: Medicare Other | Source: Ambulatory Visit | Attending: Nurse Practitioner | Admitting: Nurse Practitioner

## 2017-09-13 DIAGNOSIS — M4722 Other spondylosis with radiculopathy, cervical region: Secondary | ICD-10-CM | POA: Diagnosis not present

## 2017-09-13 DIAGNOSIS — M792 Neuralgia and neuritis, unspecified: Secondary | ICD-10-CM

## 2017-09-13 DIAGNOSIS — M4712 Other spondylosis with myelopathy, cervical region: Secondary | ICD-10-CM | POA: Insufficient documentation

## 2017-09-13 DIAGNOSIS — X58XXXD Exposure to other specified factors, subsequent encounter: Secondary | ICD-10-CM | POA: Diagnosis not present

## 2017-09-13 DIAGNOSIS — Z981 Arthrodesis status: Secondary | ICD-10-CM | POA: Diagnosis not present

## 2017-09-13 DIAGNOSIS — S14125D Central cord syndrome at C5 level of cervical spinal cord, subsequent encounter: Secondary | ICD-10-CM | POA: Diagnosis not present

## 2017-09-20 DIAGNOSIS — R296 Repeated falls: Secondary | ICD-10-CM | POA: Insufficient documentation

## 2017-09-20 DIAGNOSIS — I10 Essential (primary) hypertension: Secondary | ICD-10-CM | POA: Insufficient documentation

## 2017-09-20 DIAGNOSIS — W19XXXA Unspecified fall, initial encounter: Secondary | ICD-10-CM | POA: Insufficient documentation

## 2017-09-20 DIAGNOSIS — M6281 Muscle weakness (generalized): Secondary | ICD-10-CM | POA: Insufficient documentation

## 2017-09-29 ENCOUNTER — Emergency Department (HOSPITAL_COMMUNITY): Payer: Medicare Other

## 2017-09-29 ENCOUNTER — Other Ambulatory Visit: Payer: Self-pay

## 2017-09-29 ENCOUNTER — Observation Stay (HOSPITAL_COMMUNITY)
Admission: EM | Admit: 2017-09-29 | Discharge: 2017-09-30 | Disposition: A | Discharge status: Skilled Nursing Facility | Payer: Medicare Other | Attending: Internal Medicine | Admitting: Internal Medicine

## 2017-09-29 ENCOUNTER — Observation Stay (HOSPITAL_COMMUNITY): Payer: Medicare Other

## 2017-09-29 ENCOUNTER — Encounter (HOSPITAL_COMMUNITY): Payer: Self-pay

## 2017-09-29 DIAGNOSIS — N390 Urinary tract infection, site not specified: Secondary | ICD-10-CM | POA: Diagnosis not present

## 2017-09-29 DIAGNOSIS — G9340 Encephalopathy, unspecified: Secondary | ICD-10-CM | POA: Diagnosis present

## 2017-09-29 DIAGNOSIS — Z86718 Personal history of other venous thrombosis and embolism: Secondary | ICD-10-CM | POA: Diagnosis not present

## 2017-09-29 DIAGNOSIS — N179 Acute kidney failure, unspecified: Secondary | ICD-10-CM | POA: Insufficient documentation

## 2017-09-29 DIAGNOSIS — M62838 Other muscle spasm: Secondary | ICD-10-CM | POA: Diagnosis present

## 2017-09-29 DIAGNOSIS — J189 Pneumonia, unspecified organism: Secondary | ICD-10-CM | POA: Insufficient documentation

## 2017-09-29 DIAGNOSIS — Z79899 Other long term (current) drug therapy: Secondary | ICD-10-CM | POA: Insufficient documentation

## 2017-09-29 DIAGNOSIS — B9689 Other specified bacterial agents as the cause of diseases classified elsewhere: Secondary | ICD-10-CM | POA: Insufficient documentation

## 2017-09-29 DIAGNOSIS — R05 Cough: Secondary | ICD-10-CM | POA: Diagnosis present

## 2017-09-29 DIAGNOSIS — Z880 Allergy status to penicillin: Secondary | ICD-10-CM | POA: Insufficient documentation

## 2017-09-29 DIAGNOSIS — G40909 Epilepsy, unspecified, not intractable, without status epilepticus: Secondary | ICD-10-CM | POA: Diagnosis present

## 2017-09-29 DIAGNOSIS — J209 Acute bronchitis, unspecified: Secondary | ICD-10-CM

## 2017-09-29 DIAGNOSIS — Z7901 Long term (current) use of anticoagulants: Secondary | ICD-10-CM | POA: Diagnosis not present

## 2017-09-29 DIAGNOSIS — K5909 Other constipation: Secondary | ICD-10-CM | POA: Diagnosis present

## 2017-09-29 DIAGNOSIS — F32A Depression, unspecified: Secondary | ICD-10-CM | POA: Diagnosis present

## 2017-09-29 DIAGNOSIS — R1032 Left lower quadrant pain: Secondary | ICD-10-CM | POA: Diagnosis not present

## 2017-09-29 DIAGNOSIS — I10 Essential (primary) hypertension: Secondary | ICD-10-CM | POA: Diagnosis present

## 2017-09-29 DIAGNOSIS — M109 Gout, unspecified: Secondary | ICD-10-CM | POA: Insufficient documentation

## 2017-09-29 DIAGNOSIS — R300 Dysuria: Secondary | ICD-10-CM

## 2017-09-29 DIAGNOSIS — G629 Polyneuropathy, unspecified: Secondary | ICD-10-CM

## 2017-09-29 DIAGNOSIS — F329 Major depressive disorder, single episode, unspecified: Secondary | ICD-10-CM | POA: Insufficient documentation

## 2017-09-29 DIAGNOSIS — A419 Sepsis, unspecified organism: Secondary | ICD-10-CM

## 2017-09-29 DIAGNOSIS — R0600 Dyspnea, unspecified: Secondary | ICD-10-CM | POA: Diagnosis present

## 2017-09-29 DIAGNOSIS — E785 Hyperlipidemia, unspecified: Secondary | ICD-10-CM | POA: Insufficient documentation

## 2017-09-29 DIAGNOSIS — R058 Other specified cough: Secondary | ICD-10-CM

## 2017-09-29 DIAGNOSIS — G9349 Other encephalopathy: Secondary | ICD-10-CM | POA: Diagnosis not present

## 2017-09-29 DIAGNOSIS — K219 Gastro-esophageal reflux disease without esophagitis: Secondary | ICD-10-CM | POA: Diagnosis not present

## 2017-09-29 DIAGNOSIS — J440 Chronic obstructive pulmonary disease with acute lower respiratory infection: Secondary | ICD-10-CM | POA: Insufficient documentation

## 2017-09-29 DIAGNOSIS — R059 Cough, unspecified: Secondary | ICD-10-CM

## 2017-09-29 HISTORY — DX: Major depressive disorder, single episode, unspecified: F32.9

## 2017-09-29 HISTORY — DX: Polyneuropathy, unspecified: G62.9

## 2017-09-29 HISTORY — DX: Gout, unspecified: M10.9

## 2017-09-29 HISTORY — DX: Unspecified chronic bronchitis: J42

## 2017-09-29 HISTORY — DX: Dysphagia, unspecified: R13.10

## 2017-09-29 HISTORY — DX: Depression, unspecified: F32.A

## 2017-09-29 LAB — BASIC METABOLIC PANEL
Anion gap: 13 (ref 5–15)
BUN: 17 mg/dL (ref 6–20)
CALCIUM: 8.8 mg/dL — AB (ref 8.9–10.3)
CO2: 25 mmol/L (ref 22–32)
Chloride: 103 mmol/L (ref 101–111)
Creatinine, Ser: 1.12 mg/dL — ABNORMAL HIGH (ref 0.44–1.00)
GFR calc Af Amer: 55 mL/min — ABNORMAL LOW (ref >60)
GFR, EST NON AFRICAN AMERICAN: 48 mL/min — AB (ref >60)
GLUCOSE: 94 mg/dL (ref 65–99)
Potassium: 3.8 mmol/L (ref 3.5–5.1)
Sodium: 141 mmol/L (ref 135–145)

## 2017-09-29 LAB — INFLUENZA PANEL BY PCR (TYPE A & B)
INFLAPCR: NEGATIVE
Influenza B By PCR: NEGATIVE

## 2017-09-29 LAB — I-STAT ARTERIAL BLOOD GAS, ED
ACID-BASE EXCESS: 3 mmol/L — AB (ref 0.0–2.0)
BICARBONATE: 28.2 mmol/L — AB (ref 20.0–28.0)
O2 SAT: 95 %
Patient temperature: 97.8
TCO2: 30 mmol/L (ref 22–32)
pCO2 arterial: 44.8 mmHg (ref 32.0–48.0)
pH, Arterial: 7.405 (ref 7.350–7.450)
pO2, Arterial: 72 mmHg — ABNORMAL LOW (ref 83.0–108.0)

## 2017-09-29 LAB — URINALYSIS, COMPLETE (UACMP) WITH MICROSCOPIC
Bilirubin Urine: NEGATIVE
GLUCOSE, UA: NEGATIVE mg/dL
HGB URINE DIPSTICK: NEGATIVE
KETONES UR: NEGATIVE mg/dL
Nitrite: NEGATIVE
PH: 5 (ref 5.0–8.0)
Protein, ur: NEGATIVE mg/dL
Specific Gravity, Urine: 1.013 (ref 1.005–1.030)

## 2017-09-29 LAB — CBC WITH DIFFERENTIAL/PLATELET
BASOS ABS: 0 10*3/uL (ref 0.0–0.1)
Basophils Relative: 0 %
EOS PCT: 1 %
Eosinophils Absolute: 0.2 10*3/uL (ref 0.0–0.7)
HCT: 37 % (ref 36.0–46.0)
Hemoglobin: 12.1 g/dL (ref 12.0–15.0)
Lymphocytes Relative: 14 %
Lymphs Abs: 1.8 10*3/uL (ref 0.7–4.0)
MCH: 30.6 pg (ref 26.0–34.0)
MCHC: 32.7 g/dL (ref 30.0–36.0)
MCV: 93.7 fL (ref 78.0–100.0)
MONO ABS: 0.7 10*3/uL (ref 0.1–1.0)
MONOS PCT: 5 %
Neutro Abs: 9.9 10*3/uL — ABNORMAL HIGH (ref 1.7–7.7)
Neutrophils Relative %: 80 %
PLATELETS: 165 10*3/uL (ref 150–400)
RBC: 3.95 MIL/uL (ref 3.87–5.11)
RDW: 15.9 % — AB (ref 11.5–15.5)
WBC: 12.6 10*3/uL — ABNORMAL HIGH (ref 4.0–10.5)

## 2017-09-29 LAB — I-STAT CHEM 8, ED
BUN: 17 mg/dL (ref 6–20)
CHLORIDE: 101 mmol/L (ref 101–111)
Calcium, Ion: 1.13 mmol/L — ABNORMAL LOW (ref 1.15–1.40)
Creatinine, Ser: 1 mg/dL (ref 0.44–1.00)
Glucose, Bld: 115 mg/dL — ABNORMAL HIGH (ref 65–99)
HEMATOCRIT: 35 % — AB (ref 36.0–46.0)
Hemoglobin: 11.9 g/dL — ABNORMAL LOW (ref 12.0–15.0)
POTASSIUM: 3.8 mmol/L (ref 3.5–5.1)
SODIUM: 141 mmol/L (ref 135–145)
TCO2: 30 mmol/L (ref 22–32)

## 2017-09-29 LAB — I-STAT CG4 LACTIC ACID, ED
LACTIC ACID, VENOUS: 2.39 mmol/L — AB (ref 0.5–1.9)
Lactic Acid, Venous: 2.15 mmol/L (ref 0.5–1.9)

## 2017-09-29 LAB — BRAIN NATRIURETIC PEPTIDE: B Natriuretic Peptide: 53.2 pg/mL (ref 0.0–100.0)

## 2017-09-29 LAB — TROPONIN I: Troponin I: <0.03 ng/mL (ref <0.03)

## 2017-09-29 MED ORDER — VANCOMYCIN HCL IN DEXTROSE 750-5 MG/150ML-% IV SOLN: 750.0000 mg | Freq: Two times a day (BID) | INTRAVENOUS | Status: DC

## 2017-09-29 MED ORDER — CEFEPIME HCL 1 G IJ SOLR
1.0000 g | Freq: Once | INTRAMUSCULAR | Status: AC
  Administered 2017-09-29: 1 g via INTRAVENOUS
  Filled 2017-09-29: qty 1

## 2017-09-29 MED ORDER — IPRATROPIUM-ALBUTEROL 0.5-2.5 (3) MG/3ML IN SOLN: 3.0000 mL | Freq: Four times a day (QID) | RESPIRATORY_TRACT | Status: DC | PRN

## 2017-09-29 MED ORDER — DICLOFENAC SODIUM 1 % TD GEL
2.0000 g | Freq: Four times a day (QID) | TRANSDERMAL | Status: DC
  Administered 2017-09-29 – 2017-09-30 (×5): 2 g via TOPICAL
  Filled 2017-09-29 (×2): qty 100

## 2017-09-29 MED ORDER — ALBUTEROL SULFATE (2.5 MG/3ML) 0.083% IN NEBU
5.0000 mg | INHALATION_SOLUTION | Freq: Once | RESPIRATORY_TRACT | Status: AC
  Administered 2017-09-29: 5 mg via RESPIRATORY_TRACT
  Filled 2017-09-29: qty 6

## 2017-09-29 MED ORDER — SODIUM CHLORIDE 0.9 % IV SOLN
INTRAVENOUS | Status: AC
  Administered 2017-09-29: 13:00:00 via INTRAVENOUS

## 2017-09-29 MED ORDER — PANTOPRAZOLE SODIUM 40 MG PO TBEC
40.0000 mg | DELAYED_RELEASE_TABLET | Freq: Every day | ORAL | Status: DC
  Administered 2017-09-29 – 2017-09-30 (×2): 40 mg via ORAL
  Filled 2017-09-29 (×2): qty 1

## 2017-09-29 MED ORDER — ALBUTEROL SULFATE (2.5 MG/3ML) 0.083% IN NEBU
2.5000 mg | INHALATION_SOLUTION | Freq: Once | RESPIRATORY_TRACT | Status: AC
  Administered 2017-09-29: 2.5 mg via RESPIRATORY_TRACT
  Filled 2017-09-29: qty 3

## 2017-09-29 MED ORDER — ALBUTEROL SULFATE (2.5 MG/3ML) 0.083% IN NEBU: 2.5000 mg | INHALATION_SOLUTION | RESPIRATORY_TRACT | Status: DC | PRN

## 2017-09-29 MED ORDER — SODIUM CHLORIDE 0.9 % IV BOLUS (SEPSIS)
1000.0000 mL | Freq: Once | INTRAVENOUS | Status: AC
  Administered 2017-09-29: 1000 mL via INTRAVENOUS

## 2017-09-29 MED ORDER — HYDROCORTISONE 1 % EX CREA: TOPICAL_CREAM | Freq: Two times a day (BID) | CUTANEOUS | Status: DC

## 2017-09-29 MED ORDER — ATORVASTATIN CALCIUM 40 MG PO TABS
40.0000 mg | ORAL_TABLET | Freq: Every day | ORAL | Status: DC
  Administered 2017-09-29: 40 mg via ORAL
  Filled 2017-09-29 (×2): qty 1

## 2017-09-29 MED ORDER — BUDESONIDE 0.5 MG/2ML IN SUSP
0.5000 mg | Freq: Two times a day (BID) | RESPIRATORY_TRACT | Status: DC
  Administered 2017-09-30: 0.5 mg via RESPIRATORY_TRACT
  Filled 2017-09-29 (×2): qty 2

## 2017-09-29 MED ORDER — VANCOMYCIN HCL 10 G IV SOLR
1500.0000 mg | Freq: Once | INTRAVENOUS | Status: DC
  Filled 2017-09-29: qty 1500

## 2017-09-29 MED ORDER — TIZANIDINE HCL 4 MG PO TABS: 6.0000 mg | ORAL_TABLET | Freq: Four times a day (QID) | ORAL | Status: DC | PRN

## 2017-09-29 MED ORDER — IPRATROPIUM-ALBUTEROL 0.5-2.5 (3) MG/3ML IN SOLN
3.0000 mL | Freq: Four times a day (QID) | RESPIRATORY_TRACT | Status: DC
  Administered 2017-09-29 (×2): 3 mL via RESPIRATORY_TRACT
  Filled 2017-09-29: qty 3

## 2017-09-29 MED ORDER — ALLOPURINOL 100 MG PO TABS
100.0000 mg | ORAL_TABLET | Freq: Every day | ORAL | Status: DC
  Administered 2017-09-29 – 2017-09-30 (×2): 100 mg via ORAL
  Filled 2017-09-29 (×3): qty 1

## 2017-09-29 MED ORDER — GABAPENTIN 400 MG PO CAPS
400.0000 mg | ORAL_CAPSULE | Freq: Three times a day (TID) | ORAL | Status: DC
  Administered 2017-09-29 – 2017-09-30 (×4): 400 mg via ORAL
  Filled 2017-09-29 (×4): qty 1

## 2017-09-29 MED ORDER — IPRATROPIUM-ALBUTEROL 0.5-2.5 (3) MG/3ML IN SOLN
3.0000 mL | Freq: Once | RESPIRATORY_TRACT | Status: AC
  Administered 2017-09-29: 3 mL via RESPIRATORY_TRACT
  Filled 2017-09-29: qty 3

## 2017-09-29 MED ORDER — PREDNISONE 20 MG PO TABS
60.0000 mg | ORAL_TABLET | Freq: Once | ORAL | Status: AC
  Administered 2017-09-29: 60 mg via ORAL
  Filled 2017-09-29: qty 3

## 2017-09-29 MED ORDER — MONTELUKAST SODIUM 10 MG PO TABS
10.0000 mg | ORAL_TABLET | Freq: Every day | ORAL | Status: DC
  Administered 2017-09-29: 10 mg via ORAL
  Filled 2017-09-29 (×2): qty 1

## 2017-09-29 MED ORDER — TRAMADOL HCL 50 MG PO TABS
25.0000 mg | ORAL_TABLET | Freq: Three times a day (TID) | ORAL | Status: DC | PRN
  Administered 2017-09-29: 50 mg via ORAL
  Filled 2017-09-29: qty 1

## 2017-09-29 MED ORDER — RIVAROXABAN 20 MG PO TABS
20.0000 mg | ORAL_TABLET | Freq: Every day | ORAL | Status: DC
  Administered 2017-09-29 – 2017-09-30 (×2): 20 mg via ORAL
  Filled 2017-09-29 (×3): qty 1

## 2017-09-29 MED ORDER — BACLOFEN 20 MG PO TABS
20.0000 mg | ORAL_TABLET | Freq: Four times a day (QID) | ORAL | Status: DC
  Administered 2017-09-29 – 2017-09-30 (×5): 20 mg via ORAL
  Filled 2017-09-29 (×8): qty 1

## 2017-09-29 NOTE — ED Provider Notes (Signed)
[facility] EMERGENCY DEPARTMENT Provider Note   CSN: [id number] Arrival date & time: 09/29/17  0734     History   Chief Complaint Chief Complaint  Patient presents with  . Altered Mental Status    HPI Brandy Edwards is a 72 y.o. female.  HPI  72 year old female presents with cough and shortness of breath.  EMS reports that her facility states she is altered.  When I speak to the patient she tells me she has been having cough for 2 days but cannot get anything up.  She also feels short of breath.  She has chronic bronchitis and is not sure when her most recent breathing treatment was.  She has been having left-sided chest pain during this time.  She denies any known fevers.  She is not sure if she has had any swelling.  She has chronic left-sided weakness from a prior stroke per her.  Chart review shows that she has a history of cervical myelopathy.  She also has a history of DVT and PE, but she is not sure of her medicine list or if she is currently on a blood thinner.  When I talked to the patient, she does not seem altered and answers all my questions appropriately.  These include orientation questions.  Past Medical History:  Diagnosis Date  . Acute deep vein thrombosis (DVT) of distal end of left lower extremity (HCC)   . Bilateral corneal abrasions   . Chronic bronchitis (HCC)   . Depressed   . Dysphagia   . Fusion of spine, cervical region   . Gout   . Hypertension   . Injury to ligament of cervical spine 05/2016  . Neuropathy     Patient Active Problem List   Diagnosis Date Noted  . Chronic allergic rhinitis 11/30/2016  . Muscle spasticity 11/30/2016  . Gastroesophageal reflux disease without esophagitis 11/30/2016  . Chronic constipation 11/30/2016  . Hyperlipidemia 11/30/2016  . Cervical myelopathy (HCC) 11/30/2016  . Essential hypertension 09/23/2016  . Depression 09/23/2016  . Neuropathy 09/23/2016  . Insomnia 09/23/2016    Past Surgical  History:  Procedure Laterality Date  . CESAREAN SECTION    . CHOLECYSTECTOMY    . CORPECTOMY C6    . TUBAL LIGATION      OB History    No data available       Home Medications    Prior to Admission medications   Medication Sig Start Date End Date Taking? Authorizing Provider  acetaminophen (TYLENOL) 325 MG tablet Take 650 mg by mouth every 6 (six) hours as needed for mild pain.    [provider]  allopurinol (ZYLOPRIM) 100 MG tablet Take 100 mg by mouth daily.     [provider]  alum & mag hydroxide-simeth (MAALOX PLUS) 400-400-40 MG/5ML suspension Take 30 mLs by mouth every 6 (six) hours as needed for indigestion.     [provider]  Amino Acids-Protein Hydrolys (FEEDING SUPPLEMENT, PRO-STAT SUGAR FREE 64,) LIQD Take 30 mLs by mouth 2 (two) times daily.    [provider]  amLODipine (NORVASC) 10 MG tablet Take 10 mg by mouth daily.     [provider]  atorvastatin (LIPITOR) 40 MG tablet Take 40 mg by mouth at bedtime.     [provider]  baclofen (LIORESAL) 20 MG tablet Take 20 mg by mouth every 8 (eight) hours.    [provider]  bisacodyl (DULCOLAX) 10 MG suppository Place 10 mg rectally daily as  needed for moderate constipation.     [provider]  Butalbital-APAP-Caff-Cod (FIORICET/CODEINE) 50-300-40-30 MG CAPS Take 1 capsule by mouth every 4 (four) hours as needed.     [provider]  calcium carbonate (TUMS - DOSED IN MG ELEMENTAL CALCIUM) 500 MG chewable tablet Chew 1 tablet by mouth every 4 (four) hours as needed for indigestion or heartburn.     [provider]  Carboxymethylcellulose Sod PF 0.25 % SOLN Place 1 drop into both eyes 4 (four) times daily.     [provider]  carvedilol (COREG) 6.25 MG tablet Take 6.25 mg by mouth 2 (two) times daily with a meal. Administer crushed    [provider]  desonide (DESOWEN) 0.05 % lotion Apply 1 application topically  2 (two) times daily. Apply to peri-nasal/nose    [provider]  diclofenac sodium (VOLTAREN) 1 % GEL Apply 2 g topically 4 (four) times daily. To left lower leg    [provider]  docusate sodium (COLACE) 100 MG capsule Take 100 mg by mouth 2 (two) times daily.     [provider]  gabapentin (NEURONTIN) 100 MG capsule Take 100 mg by mouth every 8 (eight) hours. Take along with 400 mg Tablet to equal 500 mg total    [provider]  gabapentin (NEURONTIN) 400 MG capsule Take 400 mg by mouth every 8 (eight) hours. Take along with Gabapentin 100 mg Tablet to equal 500 mg total    [provider]  guaiFENesin (ROBITUSSIN) 100 MG/5ML liquid Take 10 mLs by mouth every 4 (four) hours as needed for cough. Give 10 mL     [provider]  HYDROcodone-acetaminophen (NORCO/VICODIN) 5-325 MG tablet Take 1 tablet by mouth every 4 (four) hours as needed. For pain    [provider]  ipratropium-albuterol (DUONEB) 0.5-2.5 (3) MG/3ML SOLN Take 3 mLs by nebulization every 6 (six) hours as needed.    [provider]  meclizine (ANTIVERT) 12.5 MG tablet Take 12.5 mg by mouth 3 (three) times daily as needed for dizziness.     [provider]  Melatonin 3 MG TABS Take 3 mg by mouth at bedtime.    [provider]  montelukast (SINGULAIR) 10 MG tablet Take 10 mg by mouth at bedtime.     [provider]  Multiple Vitamins-Minerals (DECUBI-VITE PO) Take 1 tablet by mouth daily.     [provider]  nystatin (MYCOSTATIN/NYSTOP) powder Apply topically 2 (two) times daily. Apply to abdominal  skin fold fungal rash    [provider]  omeprazole (PRILOSEC) 40 MG capsule Take 40 mg by mouth 2 (two) times daily.     [provider]  rivaroxaban (XARELTO) 20 MG TABS tablet Take 20 mg by mouth daily with supper.     [provider]  senna (SENOKOT) 8.6 MG tablet Take 1 tablet by mouth every  evening.     [provider]  sertraline (ZOLOFT) 25 MG tablet Take 25 mg by mouth daily.     [provider]    Family History No family history on file.  Social History Social History   Tobacco Use  . Smoking status: Never Smoker  . Smokeless tobacco: Never Used  Substance Use Topics  . Alcohol use: No  . Drug use: No     Allergies   Ivp dye [iodinated diagnostic agents] and Penicillins   Review of Systems Review of Systems  Constitutional: Negative for fever.  Respiratory:  Positive for cough and shortness of breath.   Cardiovascular: Positive for chest pain. Negative for leg swelling.  Gastrointestinal: Negative for vomiting.  All other systems reviewed and are negative.    Physical Exam Updated Vital Signs LMP  (LMP Unknown)   SpO2 95%   Physical Exam  Constitutional: She is oriented to person, place, and time. She appears well-developed and well-nourished. No distress.  Leaning to the left  HENT:  Head: Normocephalic and atraumatic.  Right Ear: External ear normal.  Left Ear: External ear normal.  Nose: Nose normal.  Eyes: Right eye exhibits no discharge. Left eye exhibits no discharge.  Cardiovascular: Normal rate, regular rhythm and normal heart sounds.  Pulmonary/Chest: No accessory muscle usage. Tachypnea noted. She has wheezes (diffuse, expiratory). She exhibits no tenderness.  Abdominal: Soft. There is no tenderness.  Neurological: She is alert and oriented to person, place, and time.  Awake, alert, oriented to person, place, time and situation. Left sided extremities in contractures, able to move some. Right leg able to move a small amount. Freely moves RUE. Mild left sided facial droop - normal per EMS/facility  Skin: Skin is warm and dry. She is not diaphoretic.  Nursing note and vitals reviewed.    ED Treatments / Results  Labs (all labs ordered are listed, but only abnormal results are displayed) Labs Reviewed  BASIC  METABOLIC PANEL - Abnormal; Notable for the following components:      Result Value   Creatinine, Ser 1.12 (*)    Calcium 8.8 (*)    GFR calc non Af Amer 48 (*)    GFR calc Af Amer 55 (*)    All other components within normal limits  CBC WITH DIFFERENTIAL/PLATELET - Abnormal; Notable for the following components:   WBC 12.6 (*)    RDW 15.9 (*)    Neutro Abs 9.9 (*)    All other components within normal limits  URINALYSIS, COMPLETE (UACMP) WITH MICROSCOPIC - Abnormal; Notable for the following components:   APPearance HAZY (*)    Leukocytes, UA SMALL (*)    Bacteria, UA MANY (*)    Squamous Epithelial / LPF 0-5 (*)    All other components within normal limits  I-STAT ARTERIAL BLOOD GAS, ED - Abnormal; Notable for the following components:   pO2, Arterial 72.0 (*)    Bicarbonate 28.2 (*)    Acid-Base Excess 3.0 (*)    All other components within normal limits  I-STAT CG4 LACTIC ACID, ED - Abnormal; Notable for the following components:   Lactic Acid, Venous 2.15 (*)    All other components within normal limits  I-STAT CHEM 8, ED - Abnormal; Notable for the following components:   Glucose, Bld 115 (*)    Calcium, Ion 1.13 (*)    Hemoglobin 11.9 (*)    HCT 35.0 (*)    All other components within normal limits  I-STAT CG4 LACTIC ACID, ED - Abnormal; Notable for the following components:   Lactic Acid, Venous 2.39 (*)    All other components within normal limits  CULTURE, BLOOD (ROUTINE X 2)  CULTURE, BLOOD (ROUTINE X 2)  BRAIN NATRIURETIC PEPTIDE  TROPONIN I  INFLUENZA PANEL BY PCR (TYPE A & B)  TROPONIN I    EKG  EKG Interpretation  Date/Time:  Thursday September 29 2017 07:44:56 EST Ventricular Rate:  84 PR Interval:    QRS Duration: 94 QT Interval:  346 QTC Calculation: 409 R Axis:   35 Text Interpretation:  Sinus rhythm  no acute ST/T changes Artifact No old tracing to compare Confirmed by Pricilla Loveless [phone]) on 09/29/2017 7:52:18 AM       Radiology Abd 1  View (kub)  Result Date: 09/29/2017 CLINICAL DATA:  72 year old female with left lower quadrant abdominal pain for several days. EXAM: ABDOMEN - 1 VIEW COMPARISON:  Portable chest today reported separately. FINDINGS: KUB view of the abdomen at 1216 hrs. Mild levoconvex spinal scoliosis. No acute osseous abnormality identified. Right upper quadrant cholecystectomy clips. Aortoiliac calcified atherosclerosis. Multiple small nodular calcifications the left renal shadow compatible projecting over with nephrolithiasis. Estimated calculi size 3 millimeters each. Non obstructed bowel gas pattern. No definite pneumoperitoneum, this view is probably supine. Negative lung bases. IMPRESSION: 1.  Non obstructed bowel gas pattern. 2. Left nephrolithiasis. 3.  Calcified aortic atherosclerosis. Electronically Signed   By: Odessa Fleming M.D.   On: 09/29/2017 12:25   Dg Chest Port 1 View  Result Date: 09/29/2017 CLINICAL DATA:  Dyspnea, cough, weakness EXAM: PORTABLE CHEST 1 VIEW COMPARISON:  None. FINDINGS: Anterior cervical fusion. Stable cardiac silhouette. There is a band of atelectasis in the mid RIGHT lung. Central venous congestion noted. IMPRESSION: 1. Band atelectasis in the RIGHT mid lung. 2. Mild central venous congestion. Electronically Signed   By: Genevive Bi M.D.   On: 09/29/2017 09:03    Procedures Procedures (including critical care time)  Medications Ordered in ED Medications  ipratropium-albuterol (DUONEB) 0.5-2.5 (3) MG/3ML nebulizer solution 3 mL (not administered)  albuterol (PROVENTIL) (2.5 MG/3ML) 0.083% nebulizer solution 2.5 mg (not administered)     Initial Impression / Assessment and Plan / ED Course  I have reviewed the triage vital signs and the nursing notes.  Pertinent labs & imaging results that were available during my care of the patient were reviewed by me and considered in my medical decision making (see chart for details).     Since yesterday patient's presentation is  most consistent with pneumonia.  She has atelectasis on her chest x-ray but I think this is likely an infiltrate based on her cough and dyspnea. Was confused last night but not now. Feels better with breathing treatments but still wheezing. Given elevated WBC, mild elevated lactate, she meets criteria for sepsis but not septic shock. Fluids, broad antibiotics for HCAP (lives in facility) and admit to internal medicine teaching service.   Final Clinical Impressions(s) / ED Diagnoses   Final diagnoses:  HCAP (healthcare-associated pneumonia)  Sepsis due to pneumonia [facility])    ED Discharge Orders    None       Pricilla Loveless, MD 09/29/17 1534

## 2017-09-29 NOTE — ED Notes (Addendum)
[facility] Br[facility] [place] [place]  POA

## 2017-09-29 NOTE — ED Notes (Signed)
Attempted Report 

## 2017-09-29 NOTE — ED Notes (Signed)
Patient transported to X-ray 

## 2017-09-29 NOTE — ED Notes (Signed)
Attempted report X2 

## 2017-09-29 NOTE — ED Notes (Signed)
Waiting on cefepime from pharmacy ° °

## 2017-09-29 NOTE — ED Notes (Signed)
Report given to [facility] RN 

## 2017-09-29 NOTE — Progress Notes (Signed)
Pharmacy Antibiotic Note  Brandy McardleBetty Edwards is a 72 y.o. female admitted on 09/29/2017 with pneumonia/HCAP. Allergy listed for penicillins and reaction was itching per patient.  Cefepime ordered in ED and MD okay with challenging. Pharmacy has been consulted for vancomycin dosing.  Plan: - vancomycin 1500 mg load x 1 - vancomycin 750 mg every 12 hours - goal trough 15-20 mcg/ml  -monitor clinical progression, renal function, and trough as needed     Temp (24hrs), Avg:98.5 F (36.9 C), Min:97.8 F (36.6 C), Max:99.2 F (37.3 C)  Recent Labs  Lab 09/29/17 0756  WBC 12.6*  CREATININE 1.12*    CrCl cannot be calculated (Unknown ideal weight.).    Allergies  Allergen Reactions  . Ivp Dye [Iodinated Diagnostic Agents]   . Penicillins     Antimicrobials this admission: 1/31 cefepime >>  1/31 vancomycin >>   Dose adjustments this admission: N/A  Microbiology results: 1/29 BCx: x 2 pending  Thank you for allowing pharmacy to be a part of this patient's care.  Harlow AsaAmber C Mella Inclan, PharmD 09/29/2017 9:29 AM

## 2017-09-29 NOTE — Progress Notes (Signed)
Pt admitted to the unit at 1800. Pt mental status is oriented. Pt oriented to room, staff, and call bell. Skin is intact. Full assessment charted in [facility]. Call bell within reach. Visitor guidelines reviewed w/ pt and/or family.
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
## 2017-09-29 NOTE — ED Notes (Signed)
Pharmacy at bedside

## 2017-09-29 NOTE — ED Triage Notes (Signed)
Pt coming from [facility]: Per [facility], staff called out for altered mental status and difficulty breathing. Pt had rhonchi throughout, possible wheezing. Pt was about 96% on 2 L [place] with [facility]. Pt on constant O2 on 2 L [place]. Baseline mental status is A&O X4, with [facility] pt is A&O X2, Pt had previous stroke with deficits on the left side. No new deficits today. Pt denies pain. Last known normal was 11 pm. Pt is quadriplegic. Per [facility], pts pupils looked small.

## 2017-09-30 ENCOUNTER — Observation Stay (HOSPITAL_COMMUNITY): Payer: Medicare Other

## 2017-09-30 DIAGNOSIS — J449 Chronic obstructive pulmonary disease, unspecified: Secondary | ICD-10-CM | POA: Diagnosis not present

## 2017-09-30 DIAGNOSIS — J440 Chronic obstructive pulmonary disease with acute lower respiratory infection: Secondary | ICD-10-CM | POA: Diagnosis not present

## 2017-09-30 DIAGNOSIS — N390 Urinary tract infection, site not specified: Secondary | ICD-10-CM

## 2017-09-30 DIAGNOSIS — R0789 Other chest pain: Secondary | ICD-10-CM | POA: Diagnosis not present

## 2017-09-30 DIAGNOSIS — B9689 Other specified bacterial agents as the cause of diseases classified elsewhere: Secondary | ICD-10-CM | POA: Diagnosis not present

## 2017-09-30 DIAGNOSIS — Z91041 Radiographic dye allergy status: Secondary | ICD-10-CM

## 2017-09-30 DIAGNOSIS — K5909 Other constipation: Secondary | ICD-10-CM

## 2017-09-30 DIAGNOSIS — G9349 Other encephalopathy: Secondary | ICD-10-CM | POA: Diagnosis not present

## 2017-09-30 DIAGNOSIS — Z880 Allergy status to penicillin: Secondary | ICD-10-CM

## 2017-09-30 DIAGNOSIS — G9340 Encephalopathy, unspecified: Secondary | ICD-10-CM

## 2017-09-30 DIAGNOSIS — R918 Other nonspecific abnormal finding of lung field: Secondary | ICD-10-CM

## 2017-09-30 DIAGNOSIS — J209 Acute bronchitis, unspecified: Secondary | ICD-10-CM

## 2017-09-30 LAB — COMPREHENSIVE METABOLIC PANEL
ALK PHOS: 77 U/L (ref 38–126)
ALT: 29 U/L (ref 14–54)
ANION GAP: 11 (ref 5–15)
AST: 21 U/L (ref 15–41)
Albumin: 3.2 g/dL — ABNORMAL LOW (ref 3.5–5.0)
BUN: 10 mg/dL (ref 6–20)
CO2: 23 mmol/L (ref 22–32)
Calcium: 9.1 mg/dL (ref 8.9–10.3)
Chloride: 107 mmol/L (ref 101–111)
Creatinine, Ser: 0.69 mg/dL (ref 0.44–1.00)
GFR calc non Af Amer: >60 mL/min (ref >60)
Glucose, Bld: 95 mg/dL (ref 65–99)
Potassium: 4.1 mmol/L (ref 3.5–5.1)
SODIUM: 141 mmol/L (ref 135–145)
TOTAL PROTEIN: 6.6 g/dL (ref 6.5–8.1)
Total Bilirubin: 0.8 mg/dL (ref 0.3–1.2)

## 2017-09-30 LAB — CBC
HCT: 36.7 % (ref 36.0–46.0)
HEMOGLOBIN: 11.9 g/dL — AB (ref 12.0–15.0)
MCH: 30 pg (ref 26.0–34.0)
MCHC: 32.4 g/dL (ref 30.0–36.0)
MCV: 92.4 fL (ref 78.0–100.0)
Platelets: 159 10*3/uL (ref 150–400)
RBC: 3.97 MIL/uL (ref 3.87–5.11)
RDW: 15 % (ref 11.5–15.5)
WBC: 12.6 10*3/uL — ABNORMAL HIGH (ref 4.0–10.5)

## 2017-09-30 LAB — MRSA PCR SCREENING: MRSA BY PCR: NEGATIVE

## 2017-09-30 MED ORDER — IPRATROPIUM-ALBUTEROL 0.5-2.5 (3) MG/3ML IN SOLN
3.0000 mL | Freq: Three times a day (TID) | RESPIRATORY_TRACT | Status: DC
  Administered 2017-09-30 (×2): 3 mL via RESPIRATORY_TRACT
  Filled 2017-09-30 (×2): qty 3

## 2017-09-30 MED ORDER — KETOROLAC TROMETHAMINE 15 MG/ML IJ SOLN
15.0000 mg | Freq: Once | INTRAMUSCULAR | Status: AC | PRN
  Administered 2017-09-30: 15 mg via INTRAVENOUS
  Filled 2017-09-30: qty 1

## 2017-09-30 MED ORDER — SULFAMETHOXAZOLE-TRIMETHOPRIM 800-160 MG PO TABS
1.0000 | ORAL_TABLET | Freq: Two times a day (BID) | ORAL | Status: DC
  Administered 2017-09-30: 1 via ORAL
  Filled 2017-09-30: qty 1

## 2017-09-30 MED ORDER — SULFAMETHOXAZOLE-TRIMETHOPRIM 800-160 MG PO TABS: 1.0000 | ORAL_TABLET | Freq: Two times a day (BID) | ORAL | Status: AC

## 2017-09-30 MED ORDER — POLYETHYLENE GLYCOL 3350 17 G PO PACK: 17.0000 g | PACK | Freq: Every day | ORAL | 0 refills | Status: DC | PRN

## 2017-09-30 NOTE — Care Management Obs Status (Signed)
MEDICARE OBSERVATION STATUS NOTIFICATION   Patient Details  Name: Brandy Edwards MRN: [id number] Date of Birth: 09-20-1945   Medicare Observation Status Notification Given:  Yes    [name], RN 09/30/2017, 2:38 PM

## 2017-09-30 NOTE — Progress Notes (Signed)
Internal Medicine Attending  Date: 09/30/2017  Patient name: Brandy Edwards Medical record number: [id number] Date of birth: 1945/12/06 Age: 72 y.o. Gender: female  I saw and evaluated the patient. I reviewed the resident's note by Dr. Alinda MoneyMelvin and I agree with the resident's findings and plans as documented in his progress note with the following modifications:  The patient's transient confusion was likely related to a symptomatic urinary tract infection which is being treated with a 7 not 10 day course of Bactrim DS. The finding on chest x-ray is likely chronic and there is no evidence of pneumonia.

## 2017-09-30 NOTE — Progress Notes (Signed)
Pt prepared for d/c to SNF. IV d/c'd. Skin intact except as charted in most recent assessments. Vitals are stable. Attempted to call report to receiving facility. Was put on hold a few times while call was transferred but never came through. Stayed on phone for 12 min with no success. Pt to be transported by ambulance service.
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
## 2017-09-30 NOTE — Clinical Social Work Note (Signed)
Clinical Social Work Assessment  Patient Details  Name: Brandy Edwards MRN: [id number] Date of Birth: Dec 19, 1945  Date of referral:  09/30/17               Reason for consult:  Facility Placement                Permission sought to share information with:  Facility Medical sales representativeContact Representative, Family Supports Permission granted to share information::  Yes, Verbal Permission Granted  Name::        [facility]  Relationship::     Contact Information:     Housing/Transportation Living arrangements for the past 2 months:  Skilled Building surveyorursing Facility Source of Information:  Patient Patient Interpreter Needed:  None Criminal Activity/Legal Involvement Pertinent to Current Situation/Hospitalization:  No - Comment as needed Significant Relationships:  Adult Children, Other Family Members Lives with:  Facility Resident Do you feel safe going back to the place where you live?  Yes Need for family participation in patient care:  No (Coment)  Care giving concerns:  CSW received consult regarding discharge planning. CSW to continue to follow and assist with discharge planning needs.   Social Worker assessment / plan:  CSW spoke with patient concerning return to [facility].  Employment status:  Retired Database administratornsurance information:  Managed Medicare PT Recommendations:  Skilled Nursing Facility Information / Referral to community resources:  Skilled Nursing Facility  Patient/Family's Response to care: Patient reports agreement with discharge plan. She states she does not like ambulances, but doesn't want to bother her family at work and isn't sure how they would get her inside.   Patient/Family's Understanding of and Emotional Response to Diagnosis, Current Treatment, and Prognosis:  Patient/family is realistic regarding therapy needs and expressed being hopeful for return to SNF placement. Patient expressed understanding of CSW role and discharge process as well as medical condition. No  questions/concerns about plan or treatment.    Emotional Assessment Appearance:  Appears stated age Attitude/Demeanor/Rapport:  Charismatic, Gracious, Engaged Affect (typically observed):  Accepting, Appropriate, Pleasant Orientation:  Oriented to Self, Oriented to Situation, Oriented to Place, Oriented to  Time Alcohol / Substance use:  Not Applicable Psych involvement (Current and /or in the community):  No (Comment)  Discharge Needs  Concerns to be addressed:  Care Coordination Readmission within the last 30 days:  No Current discharge risk:  None Barriers to Discharge:  No Barriers Identified   Mearl Latinadia S Kanisha Duba, LCSWA 09/30/2017, 4:03 PM

## 2017-09-30 NOTE — Progress Notes (Signed)
[facility] Anticipated DC date: 09/30/17 Family notified: Patient declined and reported she would notify family Transport by: PTAR 4:30pm   Per MD [facility]. RN, patient, patient's family, and facility notified of DC. Discharge Summary sent to facility. RN given number for report [phone]). DC packet on chart. Ambulance transport requested for patient.   CSW signing off.  Cristobal GoldmannNadia Tanush Drees, [place]CSWA Clinical Social Worker [phone]

## 2017-10-01 LAB — URINE CULTURE: Culture: <10000 — AB

## 2017-10-04 LAB — CULTURE, BLOOD (ROUTINE X 2)
CULTURE: NO GROWTH
Culture: NO GROWTH
SPECIAL REQUESTS: ADEQUATE
Special Requests: ADEQUATE

## 2017-12-14 DIAGNOSIS — H903 Sensorineural hearing loss, bilateral: Secondary | ICD-10-CM | POA: Insufficient documentation

## 2017-12-14 DIAGNOSIS — H6123 Impacted cerumen, bilateral: Secondary | ICD-10-CM | POA: Insufficient documentation

## 2018-03-08 ENCOUNTER — Ambulatory Visit (INDEPENDENT_AMBULATORY_CARE_PROVIDER_SITE_OTHER): Payer: Medicare Other | Admitting: Vascular Surgery

## 2018-03-08 ENCOUNTER — Encounter (INDEPENDENT_AMBULATORY_CARE_PROVIDER_SITE_OTHER): Payer: Self-pay | Admitting: Vascular Surgery

## 2018-03-08 VITALS — BP 158/87 | HR 68 | Resp 16 | Ht 60.0 in | Wt 192.0 lb

## 2018-03-08 DIAGNOSIS — E782 Mixed hyperlipidemia: Secondary | ICD-10-CM

## 2018-03-08 DIAGNOSIS — R600 Localized edema: Secondary | ICD-10-CM

## 2018-03-08 DIAGNOSIS — M79605 Pain in left leg: Secondary | ICD-10-CM | POA: Diagnosis not present

## 2018-03-08 DIAGNOSIS — I10 Essential (primary) hypertension: Secondary | ICD-10-CM | POA: Diagnosis not present

## 2018-03-08 DIAGNOSIS — M79604 Pain in right leg: Secondary | ICD-10-CM
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
## 2018-04-11 ENCOUNTER — Other Ambulatory Visit: Payer: Self-pay

## 2018-04-11 ENCOUNTER — Ambulatory Visit (INDEPENDENT_AMBULATORY_CARE_PROVIDER_SITE_OTHER): Payer: Medicare Other | Admitting: Vascular Surgery

## 2018-04-11 ENCOUNTER — Encounter (INDEPENDENT_AMBULATORY_CARE_PROVIDER_SITE_OTHER): Payer: Self-pay | Admitting: Vascular Surgery

## 2018-04-11 VITALS — BP 112/64 | HR 62 | Resp 16 | Ht 60.0 in | Wt 194.0 lb

## 2018-04-11 DIAGNOSIS — M79604 Pain in right leg: Secondary | ICD-10-CM

## 2018-04-11 DIAGNOSIS — M79605 Pain in left leg: Secondary | ICD-10-CM | POA: Diagnosis not present

## 2018-04-11 DIAGNOSIS — I890 Lymphedema, not elsewhere classified: Secondary | ICD-10-CM | POA: Insufficient documentation

## 2018-04-11 NOTE — Progress Notes (Signed)
Subjective:    Patient ID: Brandy Edwards, female    DOB: [birth year], 72 y.o.   MRN: [id number] Chief Complaint  Patient presents with  . Follow-up    unna boot check   Patient presents for a monthly Unna boot therapy/lymphedema follow-up.  Patient seen with aid.  Patient reports some improvement in her bilateral lower extremity edema and discomfort since undergoing weekly 3 layer zinc oxide Unna wrap therapy to the bilateral lower extremity.  The patient notes that she has been elevating her legs heart level or higher as much as possible.  She denies any new ulcer formation.  Patient denies any recent bouts of cellulitis.  The patient is still experiencing edema and discomfort even though she has been treated with Unna wraps for a month and elevating her legs.  The patient is paralyzed from the waist down and tries her best to be active.  Patient denies any fever, nausea vomiting.  Review of Systems  Constitutional: Negative.   HENT: Negative.   Eyes: Negative.   Respiratory: Negative.   Cardiovascular: Positive for leg swelling.  Gastrointestinal: Negative.   Endocrine: Negative.   Genitourinary: Negative.   Musculoskeletal: Negative.   Skin: Negative.   Allergic/Immunologic: Negative.   Neurological: Negative.   Hematological: Negative.   Psychiatric/Behavioral: Negative.       Objective:   Physical Exam  Constitutional: She is oriented to person, place, and time. She appears well-developed and well-nourished. No distress.  HENT:  Head: Normocephalic and atraumatic.  Right Ear: External ear normal.  Left Ear: External ear normal.  Mouth/Throat: Oropharynx is clear and moist.  Eyes: Pupils are equal, round, and reactive to light. Conjunctivae and EOM are normal.  Neck: Normal range of motion.  Cardiovascular: Normal rate, regular rhythm and normal heart sounds.  Pulmonary/Chest: Effort normal and breath sounds normal.  Musculoskeletal: Normal range of motion. She exhibits  edema (Mild to moderate 1+ pitting edema noted bilaterally).  Neurological: She is alert and oriented to person, place, and time.  Skin: Skin is warm and dry. She is not diaphoretic.  No cellulitis or ulcerations noted bilaterally.  Psychiatric: She has a normal mood and affect. Her behavior is normal. Judgment and thought content normal.  Vitals reviewed.  BP 112/64   Pulse 62   Resp 16   Ht 5' (1.524 m)   Wt 194 lb (88 kg)   LMP  (LMP Unknown)   BMI 37.89 kg/m   Past Medical History:  Diagnosis Date  . Acute deep vein thrombosis (DVT) of distal end of left lower extremity (HCC)   . Bilateral corneal abrasions   . Chronic bronchitis (HCC)   . Depressed   . Dysphagia   . Fusion of spine, cervical region   . Gout   . Hypertension   . Injury to ligament of cervical spine 05/2016  . Neuropathy    Social History   Socioeconomic History  . Marital status: Legally Separated    Spouse name: Not on file  . Number of children: Not on file  . Years of education: Not on file  . Highest education level: Not on file  Occupational History  . Not on file  Social Needs  . Financial resource strain: Not on file  . Food insecurity:    Worry: Not on file    Inability: Not on file  . Transportation needs:    Medical: Not on file    Non-medical: Not on file  Tobacco Use  . Smoking  status: Never Smoker  . Smokeless tobacco: Never Used  Substance and Sexual Activity  . Alcohol use: No  . Drug use: No  . Sexual activity: Not on file  Lifestyle  . Physical activity:    Days per week: Not on file    Minutes per session: Not on file  . Stress: Not on file  Relationships  . Social connections:    Talks on phone: Not on file    Gets together: Not on file    Attends religious service: Not on file    Active member of club or organization: Not on file    Attends meetings of clubs or organizations: Not on file    Relationship status: Not on file  . Intimate partner violence:     Fear of current or ex partner: Not on file    Emotionally abused: Not on file    Physically abused: Not on file    Forced sexual activity: Not on file  Other Topics Concern  . Not on file  Social History Narrative  . Not on file   Past Surgical History:  Procedure Laterality Date  . CESAREAN SECTION    . CHOLECYSTECTOMY    . CORPECTOMY C6    . TUBAL LIGATION     Family History  Problem Relation Age of Onset  . Diabetes Mother   . Congestive Heart Failure Mother   . Hyperlipidemia Father   . Hypertension Father   . Stroke Father    Allergies  Allergen Reactions  . Penicillins Itching    Issue at a young age and accompanied by SOB  . Ivp Dye [Iodinated Diagnostic Agents]       Assessment & Plan:  Patient presents for a monthly Unna boot therapy/lymphedema follow-up.  Patient seen with aid.  Patient reports some improvement in her bilateral lower extremity edema and discomfort since undergoing weekly 3 layer zinc oxide Unna wrap therapy to the bilateral lower extremity.  The patient notes that she has been elevating her legs heart level or higher as much as possible.  She denies any new ulcer formation.  Patient denies any recent bouts of cellulitis.  The patient is still experiencing edema and discomfort even though she has been treated with Unna wraps for a month and elevating her legs.  The patient is paralyzed from the waist down and tries her best to be active.  Patient denies any fever, nausea vomiting.  1. Lymphedema - Stable Patient presents today after a month of undergoing 3 layer zinc oxide Unna wraps to the bilateral lower extremity Patient has been elevating her legs heart level or higher as much as possible Patient is paralyzed from the waist down however does try to be as active as possible The patient is still experiencing edema to the bilateral lower extremity which is associated with some discomfort The patient feels that this discomfort has progressed to the  point that she is unable to function on a daily basis and it has become lifestyle limiting. The patient would greatly benefit from the added therapy of a lymphedema pump I will apply to her insurance Patient would like to transition into medical grade 1 compression socks at this time to see if they provide better control of her edema. She was given a prescription as well as the information to elastic therapy to purchase the socks To follow-up in 6 months to assess her progress with conservative therapy and a lymphedema pump The patient was instructed to call the  office in the interim if any worsening edema or ulcerations to the legs, feet or toes occurs. The patient expresses their understanding.  2. Lower extremity pain, bilateral - Stable As Above  Current Outpatient Medications on File Prior to Visit  Medication Sig Dispense Refill  . acetaminophen (TYLENOL) 325 MG tablet Take 650 mg by mouth 3 (three) times daily.     Marland Kitchen. allopurinol (ZYLOPRIM) 100 MG tablet Take 100 mg by mouth daily.     Marland Kitchen. alum & mag hydroxide-simeth (MAALOX PLUS) 400-400-40 MG/5ML suspension Take 30 mLs by mouth every 6 (six) hours as needed for indigestion.     Marland Kitchen. amLODipine (NORVASC) 10 MG tablet Take 10 mg by mouth daily.     Marland Kitchen. atorvastatin (LIPITOR) 40 MG tablet Take 40 mg by mouth at bedtime.     . baclofen (LIORESAL) 20 MG tablet Take 20 mg by mouth 4 (four) times daily.     . bisacodyl (DULCOLAX) 10 MG suppository Place 10 mg rectally daily as needed for moderate constipation.     . budesonide (PULMICORT) 0.5 MG/2ML nebulizer solution Take 0.5 mg by nebulization 2 (two) times daily.    . carbidopa-levodopa (SINEMET IR) 25-100 MG tablet Take 1 tablet by mouth 3 (three) times daily.    . Carboxymethylcellulose Sod PF 0.25 % SOLN Place 1 drop into both eyes 4 (four) times daily.     . carvedilol (COREG) 6.25 MG tablet Take 6.25 mg by mouth 2 (two) times daily with a meal. Administer crushed    . chlorhexidine  (PERIDEX) 0.12 % solution Swish and spit 15 mLs 2 (two) times daily.    Marland Kitchen. desonide (DESOWEN) 0.05 % lotion Apply 1 application topically 2 (two) times daily. Apply to peri-nasal/nose    . docusate sodium (COLACE) 100 MG capsule Take 100 mg by mouth 2 (two) times daily.     Marland Kitchen. gabapentin (NEURONTIN) 100 MG capsule Take 100 mg by mouth every 8 (eight) hours. Take along with 400 mg Tablet to equal 500 mg total    . gabapentin (NEURONTIN) 400 MG capsule Take 400 mg by mouth every 8 (eight) hours. Take along with Gabapentin 100 mg Tablet to equal 500 mg total    . ipratropium-albuterol (DUONEB) 0.5-2.5 (3) MG/3ML SOLN Take 3 mLs by nebulization every 6 (six) hours as needed.    . Melatonin 3 MG TABS Take 3 mg by mouth at bedtime.    . montelukast (SINGULAIR) 10 MG tablet Take 10 mg by mouth at bedtime.     Marland Kitchen. nystatin (MYCOSTATIN) 100000 UNIT/ML suspension Take 5 mLs by mouth 4 (four) times daily.    Marland Kitchen. nystatin (MYCOSTATIN/NYSTOP) powder Apply topically.    Marland Kitchen. omeprazole (PRILOSEC) 20 MG capsule Take 20 mg by mouth 2 (two) times daily before a meal.    . polyethylene glycol (MIRALAX / GLYCOLAX) packet Take 17 g by mouth daily as needed. 14 each 0  . rivaroxaban (XARELTO) 20 MG TABS tablet Take 20 mg by mouth daily with supper.     . senna (SENOKOT) 8.6 MG tablet Take 1 tablet by mouth every evening.     Marland Kitchen. tiZANidine (ZANAFLEX) 4 MG tablet Take 6 mg by mouth every 6 (six) hours as needed for muscle spasms. Take with 2 mg for a total of 6 mg    . traMADol (ULTRAM) 50 MG tablet Take 25-50 mg by mouth every 8 (eight) hours as needed for severe pain. 25 mg every 8 hours as needed.    .Marland Kitchen  diclofenac sodium (VOLTAREN) 1 % GEL Apply 2 g topically 4 (four) times daily. To left lower leg    . predniSONE (DELTASONE) 5 MG tablet Take 10 mg by mouth daily with breakfast. For 4 days- started on 09-24-17     No current facility-administered medications on file prior to visit.    There are no Patient Instructions on file  for this visit. No follow-ups on file.  Kysa Calais A Ellenor Wisniewski, PA-C

## 2018-04-12 ENCOUNTER — Ambulatory Visit (INDEPENDENT_AMBULATORY_CARE_PROVIDER_SITE_OTHER): Payer: Medicare Other | Admitting: Podiatry

## 2018-04-12 DIAGNOSIS — M79676 Pain in unspecified toe(s): Secondary | ICD-10-CM

## 2018-04-12 DIAGNOSIS — B351 Tinea unguium: Secondary | ICD-10-CM | POA: Diagnosis not present

## 2018-04-13 ENCOUNTER — Ambulatory Visit (INDEPENDENT_AMBULATORY_CARE_PROVIDER_SITE_OTHER): Payer: Medicare Other | Admitting: Vascular Surgery

## 2018-04-13 ENCOUNTER — Encounter

## 2018-04-13 ENCOUNTER — Encounter (INDEPENDENT_AMBULATORY_CARE_PROVIDER_SITE_OTHER): Payer: Medicare Other

## 2018-04-14 NOTE — Progress Notes (Signed)
   SUBJECTIVE Patient presents to office today complaining of elongated, thickened nails that cause pain while wearing shoes. She is unable to trim her own nails. She is wheelchair bound and lives in a rehab facility. Patient is here for further evaluation and treatment.  Past Medical History:  Diagnosis Date  . Acute deep vein thrombosis (DVT) of distal end of left lower extremity (HCC)   . Bilateral corneal abrasions   . Chronic bronchitis (HCC)   . Depressed   . Dysphagia   . Fusion of spine, cervical region   . Gout   . Hypertension   . Injury to ligament of cervical spine 05/2016  . Neuropathy     OBJECTIVE General Patient is awake, alert, and oriented x 3 and in no acute distress. Derm Skin is dry and supple bilateral. Negative open lesions or macerations. Remaining integument unremarkable. Nails are tender, long, thickened and dystrophic with subungual debris, consistent with onychomycosis, 1-5 bilateral. No signs of infection noted. Vasc  DP and PT pedal pulses palpable bilaterally. Temperature gradient within normal limits.  Neuro Epicritic and protective threshold sensation grossly intact bilaterally.  Musculoskeletal Exam No symptomatic pedal deformities noted bilateral. Muscular strength within normal limits.  ASSESSMENT 1. Onychodystrophic nails 1-5 bilateral with hyperkeratosis of nails.  2. Onychomycosis of nail due to dermatophyte bilateral 3. Pain in foot bilateral  PLAN OF CARE 1. Patient evaluated today.  2. Instructed to maintain good pedal hygiene and foot care.  3. Mechanical debridement of nails 1-5 bilaterally performed using a nail nipper. Filed with dremel without incident.  4. Return to clinic as needed.    Felecia ShellingBrent M. Nioma Mccubbins, DPM [facility]  Dr. Felecia ShellingBrent M. Dudley Mages, DPM    [address]                                        [place], [place] [address]                Office [phone]  Fax [phone]

## 2018-08-07 ENCOUNTER — Telehealth (INDEPENDENT_AMBULATORY_CARE_PROVIDER_SITE_OTHER): Payer: Self-pay | Admitting: Vascular Surgery

## 2018-08-07 NOTE — Telephone Encounter (Signed)
Selena BattenKim wrote out a form with instructions, that I will fax over to the facility once I obtain the fax number from either a phone call or a web site check.

## 2018-08-07 NOTE — Telephone Encounter (Signed)
I will fill out the lymphedema order sheet and put it on your desk to fax over.

## 2018-09-19 ENCOUNTER — Ambulatory Visit (INDEPENDENT_AMBULATORY_CARE_PROVIDER_SITE_OTHER): Payer: Medicare Other | Admitting: Vascular Surgery

## 2018-09-19 ENCOUNTER — Encounter (INDEPENDENT_AMBULATORY_CARE_PROVIDER_SITE_OTHER): Payer: Medicare Other

## 2018-10-12 ENCOUNTER — Other Ambulatory Visit: Payer: Self-pay

## 2018-10-12 ENCOUNTER — Ambulatory Visit (INDEPENDENT_AMBULATORY_CARE_PROVIDER_SITE_OTHER): Payer: Medicare Other

## 2018-10-12 ENCOUNTER — Encounter (INDEPENDENT_AMBULATORY_CARE_PROVIDER_SITE_OTHER): Payer: Self-pay | Admitting: Nurse Practitioner

## 2018-10-12 ENCOUNTER — Ambulatory Visit (INDEPENDENT_AMBULATORY_CARE_PROVIDER_SITE_OTHER): Payer: Medicare Other | Admitting: Nurse Practitioner

## 2018-10-12 VITALS — BP 125/82 | HR 96 | Resp 10 | Ht 60.0 in | Wt 178.0 lb

## 2018-10-12 DIAGNOSIS — M79605 Pain in left leg: Secondary | ICD-10-CM | POA: Diagnosis not present

## 2018-10-12 DIAGNOSIS — M79604 Pain in right leg: Secondary | ICD-10-CM

## 2018-10-12 DIAGNOSIS — I890 Lymphedema, not elsewhere classified: Secondary | ICD-10-CM

## 2018-10-12 DIAGNOSIS — E782 Mixed hyperlipidemia: Secondary | ICD-10-CM | POA: Diagnosis not present

## 2018-10-23 ENCOUNTER — Encounter (INDEPENDENT_AMBULATORY_CARE_PROVIDER_SITE_OTHER): Payer: Self-pay | Admitting: Nurse Practitioner

## 2018-10-23 NOTE — Progress Notes (Signed)
SUBJECTIVE:  Patient ID: Brandy Edwards, female    DOB: [birth year], 73 y.o.   MRN: [id number] Chief Complaint  Patient presents with  . Follow-up    HPI  Brandy Edwards is a 73 y.o. female Patient is seen for evaluation of leg pain and leg swelling. The patient first noticed the swelling remotely.  The swelling is under much better control now that she is utilizing her lymphedema pump on a twice daily basis as well as utilizing daily medical grade 1 compression therapy.  The patient denies claudication symptoms.  The patient denies symptoms consistent with rest pain.  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  The patient has no had any past angiography, interventions or vascular surgery.  Elevation makes the leg symptoms better, dependency makes them much worse. There is no history of ulcerations. The patient denies any recent changes in medications.  The patient is placed in TED hose daily by her facility, and she utilizes her lymphedema pump on a twice daily basis.  The patient denies a history of DVT or PE. There is no prior history of phlebitis. There is no history of primary lymphedema.  No history of malignancies. No history of trauma or groin or pelvic surgery. There is no history of radiation treatment to the groin or pelvis  The patient denies amaurosis fugax or recent TIA symptoms. There are no recent neurological changes noted. The patient denies recent episodes of angina or shortness of breath  Past Medical History:  Diagnosis Date  . Acute deep vein thrombosis (DVT) of distal end of left lower extremity (HCC)   . Bilateral corneal abrasions   . Chronic bronchitis (HCC)   . Depressed   . Dysphagia   . Fusion of spine, cervical region   . Gout   . Hypertension   . Injury to ligament of cervical spine 05/2016  . Neuropathy     Past Surgical History:  Procedure Laterality Date  . CESAREAN SECTION    . CHOLECYSTECTOMY    . CORPECTOMY C6      . TUBAL LIGATION      Social History   Socioeconomic History  . Marital status: Legally Separated    Spouse name: Not on file  . Number of children: Not on file  . Years of education: Not on file  . Highest education level: Not on file  Occupational History  . Not on file  Social Needs  . Financial resource strain: Not on file  . Food insecurity:    Worry: Not on file    Inability: Not on file  . Transportation needs:    Medical: Not on file    Non-medical: Not on file  Tobacco Use  . Smoking status: Never Smoker  . Smokeless tobacco: Never Used  Substance and Sexual Activity  . Alcohol use: No  . Drug use: No  . Sexual activity: Not on file  Lifestyle  . Physical activity:    Days per week: Not on file    Minutes per session: Not on file  . Stress: Not on file  Relationships  . Social connections:    Talks on phone: Not on file    Gets together: Not on file    Attends religious service: Not on file    Active member of club or organization: Not on file    Attends meetings of clubs or organizations: Not on file    Relationship status: Not on file  . Intimate  partner violence:    Fear of current or ex partner: Not on file    Emotionally abused: Not on file    Physically abused: Not on file    Forced sexual activity: Not on file  Other Topics Concern  . Not on file  Social History Narrative  . Not on file    Family History  Problem Relation Age of Onset  . Diabetes Mother   . Congestive Heart Failure Mother   . Hyperlipidemia Father   . Hypertension Father   . Stroke Father     Allergies  Allergen Reactions  . Penicillins Itching    Issue at a young age and accompanied by SOB  . Ivp Dye [Iodinated Diagnostic Agents]   . Oxycodone-Acetaminophen Nausea And Vomiting     Review of Systems   Review of Systems: Negative Unless Checked Constitutional: [] Weight loss  [] Fever  [] Chills Cardiac: [] Chest pain   []  Atrial Fibrillation  [] Palpitations    [] Shortness of breath when laying flat   [] Shortness of breath with exertion. [] Shortness of breath at rest Vascular:  [] Pain in legs with walking   [] Pain in legs with standing [] Pain in legs when laying flat   [] Claudication    [] Pain in feet when laying flat    [] History of DVT   [] Phlebitis   [x] Swelling in legs   [] Varicose veins   [] Non-healing ulcers Pulmonary:   [] Uses home oxygen   [] Productive cough   [] Hemoptysis   [] Wheeze  [] COPD   [] Asthma Neurologic:  [] Dizziness   [] Seizures  [] Blackouts [] History of stroke   [] History of TIA  [] Aphasia   [] Temporary Blindness   [] Weakness or numbness in arm   [x] Weakness or numbness in leg Musculoskeletal:   [] Joint swelling   [] Joint pain   [] Low back pain  []  History of Knee Replacement [] Arthritis [] back Surgeries  []  Spinal Stenosis    Hematologic:  [] Easy bruising  [] Easy bleeding   [] Hypercoagulable state   [] Anemic Gastrointestinal:  [] Diarrhea   [] Vomiting  [] Gastroesophageal reflux/heartburn   [] Difficulty swallowing. [] Abdominal pain Genitourinary:  [] Chronic kidney disease   [] Difficult urination  [] Anuric   [] Blood in urine [] Frequent urination  [] Burning with urination   [] Hematuria Skin:  [] Rashes   [] Ulcers [] Wounds Psychological:  [] History of anxiety   []  History of major depression  []  Memory Difficulties      OBJECTIVE:   Physical Exam  BP 125/82 (BP Location: Right Arm, Patient Position: Sitting, Cuff Size: Large)   Pulse 96   Resp 10   Ht 5' (1.524 m)   Wt 178 lb (80.7 kg)   LMP  (LMP Unknown)   BMI 34.76 kg/m   Gen: WD/WN, NAD Head: [place]/AT, No temporalis wasting.  Ear/Nose/Throat: Hearing grossly intact, nares w/o erythema or drainage Eyes: PER, EOMI, sclera nonicteric.  Neck: Supple, no masses.  No JVD.  Pulmonary:  Good air movement, no use of accessory muscles.  Cardiac: RRR Vascular: 2+ soft edema Vessel Right Left  Radial Palpable Palpable  Dorsalis Pedis Palpable Palpable  Posterior Tibial Palpable  Palpable   Gastrointestinal: soft, non-distended. No guarding/no peritoneal signs.  Musculoskeletal: Paraplegic No deformity or atrophy.  Neurologic: Pain and light touch intact in extremities.  Symmetrical.  Speech is fluent. Motor exam as listed above. Psychiatric: Judgment intact, Mood & affect appropriate for pt's clinical situation. Dermatologic: No Venous rashes. No Ulcers Noted.  No changes consistent with cellulitis. Lymph : No Cervical lymphadenopathy, no lichenification or skin changes of chronic lymphedema.  ASSESSMENT AND PLAN:  1. Lower extremity pain, bilateral Recommend:  I do not find evidence of Vascular pathology that would explain the patient's symptoms  The patient has atypical pain symptoms for vascular disease  Noninvasive studies including venous ultrasound of the legs do not identify vascular problems  The patient should continue walking and begin a more formal exercise program. The patient should continue his antiplatelet therapy and aggressive treatment of the lipid abnormalities. The patient should begin wearing graduated compression socks 15-20 mmHg strength to control her mild edema.  Patient will follow-up with me on a PRN basis  Further work-up of her lower extremity pain is deferred to the primary service     2. Lymphedema  No surgery or intervention at this point in time.    I have reviewed my discussion with the patient regarding lymphedema and why it  causes symptoms.  Patient will continue wearing graduated compression stockings class 1 (20-30 mmHg) on a daily basis a prescription was given. The patient is reminded to put the stockings on first thing in the morning and removing them in the evening. The patient is instructed specifically not to sleep in the stockings.   In addition, behavioral modification throughout the day will be continued.  This will include frequent elevation (such as in a recliner), use of over the counter pain  medications as needed and exercise such as walking.  I have reviewed systemic causes for chronic edema such as liver, kidney and cardiac etiologies and there does not appear to be any significant changes in these organ systems over the past year.  The patient is under the impression that these organ systems are all stable and unchanged.    The patient will continue aggressive use of the  lymph pump.  This will continue to improve the edema control and prevent sequela such as ulcers and infections.   The patient will follow-up with me in 6 months   3. Mixed hyperlipidemia Continue statin as ordered and reviewed, no changes at this time    Current Outpatient Medications on File Prior to Visit  Medication Sig Dispense Refill  . acetaminophen (TYLENOL) 325 MG tablet Take 650 mg by mouth 3 (three) times daily.     Marland Kitchen allopurinol (ZYLOPRIM) 100 MG tablet Take 100 mg by mouth daily.     Marland Kitchen alum & mag hydroxide-simeth (MAALOX PLUS) 400-400-40 MG/5ML suspension Take 30 mLs by mouth every 6 (six) hours as needed for indigestion.     Marland Kitchen amLODipine (NORVASC) 10 MG tablet Take 10 mg by mouth daily.     Marland Kitchen atorvastatin (LIPITOR) 40 MG tablet Take 80 mg by mouth at bedtime.     . baclofen (LIORESAL) 20 MG tablet Take 20 mg by mouth 4 (four) times daily.     . bisacodyl (DULCOLAX) 10 MG suppository Place 10 mg rectally daily as needed for moderate constipation.     . budesonide (PULMICORT) 0.5 MG/2ML nebulizer solution Take 0.5 mg by nebulization 2 (two) times daily.    . butalbital-acetaminophen-caffeine (FIORICET, ESGIC) 50-325-40 MG tablet Take by mouth.    . calcium carbonate (TUMS EX) 750 MG chewable tablet Chew by mouth.    . carbidopa-levodopa (SINEMET IR) 25-100 MG tablet Take 1 tablet by mouth 3 (three) times daily.    . Carboxymethylcellulose Sod PF 0.25 % SOLN Place 1 drop into both eyes 4 (four) times daily.     . carvedilol (COREG) 6.25 MG tablet Take 6.25 mg by mouth 2 (two) times  daily with a  meal. Administer crushed    . chlorhexidine (PERIDEX) 0.12 % solution Swish and spit 15 mLs 2 (two) times daily.    Marland Kitchen desonide (DESOWEN) 0.05 % lotion Apply 1 application topically 2 (two) times daily. Apply to peri-nasal/nose    . diclofenac sodium (VOLTAREN) 1 % GEL Apply 2 g topically 4 (four) times daily. To left lower leg    . docusate sodium (COLACE) 100 MG capsule Take 100 mg by mouth 2 (two) times daily.     Marland Kitchen gabapentin (NEURONTIN) 100 MG capsule Take 100 mg by mouth every 8 (eight) hours. Take along with 400 mg Tablet to equal 500 mg total    . gabapentin (NEURONTIN) 400 MG capsule Take 400 mg by mouth every 8 (eight) hours. Take along with Gabapentin 100 mg Tablet to equal 500 mg total    . guaifenesin (COUGH SYRUP) 100 MG/5ML syrup Take by mouth.    Marland Kitchen HYDROcodone-acetaminophen (NORCO) 10-325 MG tablet     . ipratropium-albuterol (DUONEB) 0.5-2.5 (3) MG/3ML SOLN Take 3 mLs by nebulization every 6 (six) hours as needed.    . lactulose (CHRONULAC) 10 GM/15ML solution Take by mouth.    . meclizine (ANTIVERT) 12.5 MG tablet Take by mouth.    . Melatonin 3 MG TABS Take 3 mg by mouth at bedtime.    . montelukast (SINGULAIR) 10 MG tablet Take 10 mg by mouth at bedtime.     . Multiple Vitamin (MULTI-VITAMINS) TABS Take by mouth.    . nystatin (MYCOSTATIN) 100000 UNIT/ML suspension Take 5 mLs by mouth 4 (four) times daily.    Marland Kitchen nystatin (MYCOSTATIN/NYSTOP) powder Apply topically.    Marland Kitchen omeprazole (PRILOSEC) 20 MG capsule Take 20 mg by mouth 2 (two) times daily before a meal.    . polyethylene glycol (MIRALAX / GLYCOLAX) packet Take 17 g by mouth daily as needed. 14 each 0  . predniSONE (DELTASONE) 5 MG tablet Take 10 mg by mouth daily with breakfast. For 4 days- started on 09-24-17    . rivaroxaban (XARELTO) 20 MG TABS tablet Take 20 mg by mouth daily with supper.     . senna (SENOKOT) 8.6 MG tablet Take 1 tablet by mouth every evening.     . sertraline (ZOLOFT) 25 MG tablet Take by mouth.      Marland Kitchen tiZANidine (ZANAFLEX) 4 MG tablet Take 6 mg by mouth every 6 (six) hours as needed for muscle spasms. Take with 2 mg for a total of 6 mg    . traMADol (ULTRAM) 50 MG tablet Take 25-50 mg by mouth every 8 (eight) hours as needed for severe pain. 25 mg every 8 hours as needed.     No current facility-administered medications on file prior to visit.     There are no Patient Instructions on file for this visit. No follow-ups on file.   Georgiana Spinner, NP  This note was completed with Office manager.  Any errors are purely unintentional.

## 2018-12-11 ENCOUNTER — Telehealth: Payer: Self-pay

## 2018-12-11 NOTE — Telephone Encounter (Signed)
I contacted Brandy Edwards with [facility] in regards to the the pt's scheduled appt for 12/13/18.  I advised due to current COVID 19 pandemic, our office is severely reducing in office visits for at least the next 2 weeks, in order to minimize the risk to our patients and healthcare providers.   Pt has been rescheduled for 02/14/19 at 200 pm with a check in time of 1:30 pm.

## 2018-12-13 ENCOUNTER — Ambulatory Visit: Payer: Medicaid Other | Admitting: Neurology

## 2019-02-14 ENCOUNTER — Other Ambulatory Visit: Payer: Self-pay

## 2019-02-14 ENCOUNTER — Encounter: Payer: Self-pay | Admitting: Neurology

## 2019-02-14 ENCOUNTER — Ambulatory Visit (INDEPENDENT_AMBULATORY_CARE_PROVIDER_SITE_OTHER): Payer: Medicare Other | Admitting: Neurology

## 2019-02-14 VITALS — BP 142/86 | HR 71 | Temp 97.5°F

## 2019-02-14 DIAGNOSIS — G959 Disease of spinal cord, unspecified: Secondary | ICD-10-CM | POA: Diagnosis not present

## 2019-02-14 MED ORDER — OXCARBAZEPINE 150 MG PO TABS: ORAL_TABLET | ORAL | 2 refills | Status: DC

## 2019-02-14 NOTE — Patient Instructions (Addendum)
We will start Trileptal for the left leg pain, start one tablet twice a day for 2 weeks, then take 2 tablets twice a day.  Trileptal (oxcarbazepine) is a seizure medication that is sometimes also used for nerve related pain. As with any seizure medication, this drug may worsen depression. Occasionally, there may be other side effects that include low sodium levels, drowsiness, incoordination, dizziness, headache, nausea, double vision, or a potential allergy involving a skin rash. If you believe that you are having side effects on this medication, please contact our office.
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
## 2019-04-12 ENCOUNTER — Telehealth: Payer: Self-pay | Admitting: Neurology

## 2019-04-12 NOTE — Telephone Encounter (Signed)
Nurse Practioner Tia has called to inform that she had stop pt usage of Hyponatremia, a call back is welcomed but not requested

## 2019-04-12 NOTE — Telephone Encounter (Signed)
Noted will send FYI to Dr. Jannifer Franklin.

## 2019-04-14 NOTE — Telephone Encounter (Signed)
I am not exactly sure what this message means, I assume that it means that the Trileptal caused hyponatremia and that the Trileptal was stopped.  Unfortunately, this is a very common side effect of Trileptal.

## 2019-05-16 ENCOUNTER — Encounter: Payer: Self-pay | Admitting: Neurology

## 2019-05-16 ENCOUNTER — Other Ambulatory Visit: Payer: Self-pay

## 2019-05-16 ENCOUNTER — Ambulatory Visit (INDEPENDENT_AMBULATORY_CARE_PROVIDER_SITE_OTHER): Payer: Medicare Other | Admitting: Neurology

## 2019-05-16 VITALS — BP 112/78 | HR 97 | Temp 97.5°F

## 2019-05-16 DIAGNOSIS — G959 Disease of spinal cord, unspecified: Secondary | ICD-10-CM

## 2019-05-16 DIAGNOSIS — G894 Chronic pain syndrome: Secondary | ICD-10-CM | POA: Diagnosis not present

## 2019-05-16 HISTORY — DX: Chronic pain syndrome: G89.4

## 2019-05-16 MED ORDER — LEVETIRACETAM 250 MG PO TABS: ORAL_TABLET | ORAL | 3 refills | Status: DC

## 2019-05-16 NOTE — Patient Instructions (Signed)
We will start [place] for the neuropathic pain of the feet.
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
## 2019-09-19 ENCOUNTER — Other Ambulatory Visit: Payer: Self-pay

## 2019-09-19 ENCOUNTER — Encounter: Payer: Self-pay | Admitting: Neurology

## 2019-09-19 ENCOUNTER — Ambulatory Visit (INDEPENDENT_AMBULATORY_CARE_PROVIDER_SITE_OTHER): Payer: Medicare Other | Admitting: Neurology

## 2019-09-19 VITALS — BP 138/75 | HR 62 | Temp 97.6°F | Ht 60.0 in

## 2019-09-19 DIAGNOSIS — G959 Disease of spinal cord, unspecified: Secondary | ICD-10-CM

## 2019-09-19 DIAGNOSIS — G894 Chronic pain syndrome: Secondary | ICD-10-CM

## 2019-09-19 MED ORDER — LEVETIRACETAM 500 MG PO TABS: ORAL_TABLET | ORAL | 5 refills | Status: DC
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
## 2019-09-19 NOTE — Progress Notes (Signed)
I have read the note, and I agree with the clinical assessment and plan.  Jimmey Hengel K Sharyon Peitz   

## 2019-09-19 NOTE — Patient Instructions (Signed)
We will increase the Keppra take 500 mg in the morning, 1000 mg in the evening to see if that helps with your pain. Continue taking Lyrica. Return in 6 months.

## 2019-10-15 ENCOUNTER — Ambulatory Visit (INDEPENDENT_AMBULATORY_CARE_PROVIDER_SITE_OTHER): Payer: 59 | Admitting: Vascular Surgery

## 2019-10-29 ENCOUNTER — Ambulatory Visit (INDEPENDENT_AMBULATORY_CARE_PROVIDER_SITE_OTHER): Payer: Medicare Other | Admitting: Vascular Surgery

## 2019-10-29 ENCOUNTER — Other Ambulatory Visit: Payer: Self-pay

## 2019-10-29 ENCOUNTER — Encounter (INDEPENDENT_AMBULATORY_CARE_PROVIDER_SITE_OTHER): Payer: Self-pay | Admitting: Vascular Surgery

## 2019-10-29 ENCOUNTER — Encounter (INDEPENDENT_AMBULATORY_CARE_PROVIDER_SITE_OTHER): Payer: Self-pay

## 2019-10-29 VITALS — BP 122/80 | HR 67 | Resp 12 | Ht 60.0 in

## 2019-10-29 DIAGNOSIS — K219 Gastro-esophageal reflux disease without esophagitis: Secondary | ICD-10-CM

## 2019-10-29 DIAGNOSIS — E782 Mixed hyperlipidemia: Secondary | ICD-10-CM | POA: Diagnosis not present

## 2019-10-29 DIAGNOSIS — I890 Lymphedema, not elsewhere classified: Secondary | ICD-10-CM | POA: Diagnosis not present

## 2019-10-29 DIAGNOSIS — I10 Essential (primary) hypertension: Secondary | ICD-10-CM

## 2019-10-29 NOTE — Progress Notes (Signed)
MRN : [id number]  Brandy Edwards is a 74 y.o. (12/16/1945) female who presents with chief complaint of  Chief Complaint  Patient presents with  . Follow-up    1 year F/U  .  History of Present Illness: The patient returns to the office for followup evaluation regarding leg swelling.  The swelling has persisted but with the lymph pump is much, much better controlled. The pain associated with swelling is essentially eliminated. There have not been any interval development of a ulcerations or wounds.  The patient denies problems with the pump, noting it is working well and the leggings are in good condition.  Since the previous visit the patient has been wearing graduated compression stockings and using the lymph pump on a routine basis and  has noted significant improvement in the lymphedema.   Patient stated the lymph pump has been a very positive factor in her care.    Current Meds  Medication Sig  . acetaminophen (TYLENOL) 325 MG tablet Take 650 mg by mouth 3 (three) times daily.   Marland Kitchen allopurinol (ZYLOPRIM) 100 MG tablet Take 100 mg by mouth daily.   Marland Kitchen amLODipine (NORVASC) 10 MG tablet Take 10 mg by mouth daily.   Marland Kitchen atorvastatin (LIPITOR) 40 MG tablet Take 80 mg by mouth at bedtime.   . baclofen (LIORESAL) 20 MG tablet Take 20 mg by mouth 4 (four) times daily.   . budesonide (PULMICORT) 0.5 MG/2ML nebulizer solution Take 0.5 mg by nebulization 2 (two) times daily.  . butalbital-acetaminophen-caffeine (FIORICET, ESGIC) 50-325-40 MG tablet Take by mouth.  . carbamide peroxide (DEBROX) 6.5 % OTIC solution 5 drops 2 (two) times daily.  . carbidopa-levodopa (SINEMET IR) 25-100 MG tablet Take 1 tablet by mouth 3 (three) times daily.  . Carboxymeth-Glycerin-Polysorb (REFRESH OPTIVE ADVANCED) 0.5-1-0.5 % SOLN Apply to eye.  . carvedilol (COREG) 6.25 MG tablet Take 6.25 mg by mouth 2 (two) times daily with a meal. Administer crushed  . chlorhexidine (PERIDEX) 0.12 % solution Swish and spit  15 mLs 2 (two) times daily.  . cycloSPORINE (RESTASIS OP) Apply to eye.  Marland Kitchen desonide (DESOWEN) 0.05 % lotion Apply 1 application topically 2 (two) times daily. Apply to peri-nasal/nose  . diclofenac sodium (VOLTAREN) 1 % GEL Apply 2 g topically 4 (four) times daily. To left lower leg  . docusate sodium (COLACE) 100 MG capsule Take 100 mg by mouth 2 (two) times daily.   . ferrous sulfate 325 (65 FE) MG tablet Take 325 mg by mouth daily with breakfast.  . guaifenesin (COUGH SYRUP) 100 MG/5ML syrup Take by mouth.  . guaifenesin (ROBITUSSIN) 100 MG/5ML syrup Take by mouth.  Marland Kitchen ipratropium-albuterol (DUONEB) 0.5-2.5 (3) MG/3ML SOLN Take 3 mLs by nebulization every 6 (six) hours as needed.  . lactulose (CHRONULAC) 10 GM/15ML solution Take by mouth.  . levETIRAcetam (KEPPRA) 500 MG tablet Take 1 tablet in the morning, take 2 at bedtime  . linaclotide (LINZESS) 145 MCG CAPS capsule Take 145 mcg by mouth daily before breakfast.  . Melatonin 3 MG TABS Take 3 mg by mouth at bedtime.  . Menthol, Topical Analgesic, (BIOFREEZE) 4 % GEL Apply topically.  . montelukast (SINGULAIR) 10 MG tablet Take 10 mg by mouth at bedtime.   . Multiple Vitamin (MULTI-VITAMINS) TABS Take by mouth.  . nystatin (MYCOSTATIN) 100000 UNIT/ML suspension Take 5 mLs by mouth 4 (four) times daily.  Marland Kitchen nystatin (MYCOSTATIN/NYSTOP) powder Apply topically.  Marland Kitchen omeprazole (PRILOSEC) 20 MG capsule Take 20 mg by mouth 2 (  two) times daily before a meal.  . ondansetron (ZOFRAN) 4 MG tablet Take 4 mg by mouth every 8 (eight) hours as needed for nausea or vomiting.  . polyethylene glycol (MIRALAX / GLYCOLAX) 17 g packet Take 17 g by mouth daily.  . pregabalin (LYRICA) 75 MG capsule Take 75 mg by mouth 2 (two) times daily.  . rivaroxaban (XARELTO) 20 MG TABS tablet Take 20 mg by mouth daily with supper.   . senna (SENOKOT) 8.6 MG tablet Take 1 tablet by mouth every evening.   . traMADol-acetaminophen (ULTRACET) 37.5-325 MG tablet Take 1 tablet by  mouth every 6 (six) hours as needed.  Marland Kitchen VITAMIN D PO Take 50,000 Units by mouth.    Past Medical History:  Diagnosis Date  . Acute deep vein thrombosis (DVT) of distal end of left lower extremity ([place])   . Bilateral corneal abrasions   . Chronic bronchitis ([place])   . Chronic pain disorder 05/16/2019  . Depressed   . Dysphagia   . Fusion of spine, cervical region   . Gout   . Hypertension   . Injury to ligament of cervical spine 05/2016  . Neuropathy     Past Surgical History:  Procedure Laterality Date  . CESAREAN SECTION    . CHOLECYSTECTOMY    . CORPECTOMY C6    . TUBAL LIGATION      Social History Social History   Tobacco Use  . Smoking status: Never Smoker  . Smokeless tobacco: Never Used  Substance Use Topics  . Alcohol use: No  . Drug use: No    Family History Family History  Problem Relation Age of Onset  . Diabetes Mother   . Congestive Heart Failure Mother   . Hyperlipidemia Father   . Hypertension Father   . Stroke Father     Allergies  Allergen Reactions  . Penicillins Itching    Issue at a young age and accompanied by SOB  . Ivp Dye [Iodinated Diagnostic Agents]   . Oxycodone-Acetaminophen Nausea And Vomiting     REVIEW OF SYSTEMS (Negative unless checked)  Constitutional: [] Weight loss  [] Fever  [] Chills Cardiac: [] Chest pain   [] Chest pressure   [] Palpitations   [] Shortness of breath when laying flat   [] Shortness of breath with exertion. Vascular:  [] Pain in legs with walking   [x] Pain in legs at rest  [] History of DVT   [] Phlebitis   [x] Swelling in legs   [] Varicose veins   [] Non-healing ulcers Pulmonary:   [] Uses home oxygen   [] Productive cough   [] Hemoptysis   [] Wheeze  [] COPD   [] Asthma Neurologic:  [] Dizziness   [] Seizures   [] History of stroke   [] History of TIA  [] Aphasia   [] Vissual changes   [] Weakness or numbness in arm   [] Weakness or numbness in leg Musculoskeletal:   [] Joint swelling   [x] Joint pain   [] Low back  pain Hematologic:  [] Easy bruising  [] Easy bleeding   [] Hypercoagulable state   [] Anemic Gastrointestinal:  [] Diarrhea   [] Vomiting  [x] Gastroesophageal reflux/heartburn   [] Difficulty swallowing. Genitourinary:  [] Chronic kidney disease   [] Difficult urination  [] Frequent urination   [] Blood in urine Skin:  [] Rashes   [] Ulcers  Psychological:  [] History of anxiety   []  History of major depression.  Physical Examination  Vitals:   10/29/19 1110  BP: 122/80  Pulse: 67  Resp: 12  Height: 5' (1.524 m)   Body mass index is 34.76 kg/m. Gen: WD/WN, NAD Head: [place]/AT, No temporalis wasting.  Ear/Nose/Throat: Hearing grossly intact, nares w/o erythema or drainage Eyes: PER, EOMI, sclera nonicteric.  Neck: Supple, no large masses.   Pulmonary:  Good air movement, no audible wheezing bilaterally, no use of accessory muscles.  Cardiac: RRR, no JVD Vascular: scattered varicosities present bilaterally.  Moderate venous stasis changes to the legs bilaterally.  4+ soft pitting edema Vessel Right Left  Radial Palpable Palpable  Gastrointestinal: Non-distended. No guarding/no peritoneal signs.  Musculoskeletal: M/S 5/5 throughout.  No deformity or atrophy.  Neurologic: CN 2-12 intact. Symmetrical.  Speech is fluent. Motor exam as listed above. Psychiatric: Judgment intact, Mood & affect appropriate for pt's clinical situation. Dermatologic: No rashes or ulcers noted.  No changes consistent with cellulitis.  CBC Lab Results  Component Value Date   WBC 12.6 (H) 09/30/2017   HGB 11.9 (L) 09/30/2017   HCT 36.7 09/30/2017   MCV 92.4 09/30/2017   PLT 159 09/30/2017    BMET    Component Value Date/Time   NA 141 09/30/2017 0245   NA 144 12/06/2016 0000   K 4.1 09/30/2017 0245   CL 107 09/30/2017 0245   CO2 23 09/30/2017 0245   GLUCOSE 95 09/30/2017 0245   BUN 10 09/30/2017 0245   BUN 18 12/06/2016 0000   CREATININE 0.69 09/30/2017 0245   CALCIUM 9.1 09/30/2017 0245   GFRNONAA >60  09/30/2017 0245   GFRAA >60 09/30/2017 0245   CrCl cannot be calculated (Patient's most recent lab result is older than the maximum 21 days allowed.).  COAG No results found for: INR, PROTIME  Radiology No results found.   Assessment/Plan 1. Lymphedema  No surgery or intervention at this point in time.    I have reviewed my discussion with the patient regarding lymphedema and why it  causes symptoms.  Patient will continue wearing graduated compression stockings class 1 (20-30 mmHg) on a daily basis a prescription was given. The patient is reminded to put the stockings on first thing in the morning and removing them in the evening. The patient is instructed specifically not to sleep in the stockings.   In addition, behavioral modification throughout the day will be continued.  This will include frequent elevation (such as in a recliner), use of over the counter pain medications as needed and exercise such as walking.  I have reviewed systemic causes for chronic edema such as liver, kidney and cardiac etiologies and there does not appear to be any significant changes in these organ systems over the past year.  The patient is under the impression that these organ systems are all stable and unchanged.    The patient will continue aggressive use of the  lymph pump.  This will continue to improve the edema control and prevent sequela such as ulcers and infections.   The patient will follow-up with me on an annual basis.    2. Essential hypertension Continue antihypertensive medications as already ordered, these medications have been reviewed and there are no changes at this time.   3. Gastroesophageal reflux disease without esophagitis Continue PPI as already ordered, this medication has been reviewed and there are no changes at this time.  Avoidence of caffeine and alcohol  Moderate elevation of the head of the bed   4. Mixed hyperlipidemia Continue statin as ordered and reviewed,  no changes at this time    Levora Dredge, MD  10/29/2019 11:23 AM

## 2020-03-12 ENCOUNTER — Other Ambulatory Visit: Payer: Self-pay | Admitting: Adult Health Nurse Practitioner

## 2020-03-12 ENCOUNTER — Telehealth: Payer: Self-pay

## 2020-03-12 DIAGNOSIS — R1012 Left upper quadrant pain: Secondary | ICD-10-CM

## 2020-03-12 NOTE — Telephone Encounter (Signed)
13 hour prep instructions/order faxed to Byrd Hesselbach, RN at [facility] at [phone] (phone is [phone]).  Prednisone 50mg  PO  03/20/20 @1930 , 03/21/20 @ 0130 and 0730; Benadryl 50mg  PO 7/23 @ 0730.

## 2020-03-18 ENCOUNTER — Other Ambulatory Visit: Payer: Self-pay

## 2020-03-18 ENCOUNTER — Encounter: Payer: Self-pay | Admitting: Neurology

## 2020-03-18 ENCOUNTER — Ambulatory Visit (INDEPENDENT_AMBULATORY_CARE_PROVIDER_SITE_OTHER): Payer: Medicare Other | Admitting: Neurology

## 2020-03-18 VITALS — BP 126/82 | HR 84 | Ht 60.0 in

## 2020-03-18 DIAGNOSIS — G894 Chronic pain syndrome: Secondary | ICD-10-CM

## 2020-03-18 DIAGNOSIS — G959 Disease of spinal cord, unspecified: Secondary | ICD-10-CM | POA: Diagnosis not present

## 2020-03-18 MED ORDER — LEVETIRACETAM 500 MG PO TABS: ORAL_TABLET | ORAL | 5 refills | Status: AC

## 2020-03-18 NOTE — Progress Notes (Signed)
I have read the note, and I agree with the clinical assessment and plan.  Hallee Mckenny K Aava Deland   

## 2020-03-18 NOTE — Patient Instructions (Addendum)
Continue current medications  Continue evaluation with your primary doctor for evaluation of swelling  See you back in 6 months
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
## 2020-03-21 ENCOUNTER — Other Ambulatory Visit: Payer: Self-pay | Admitting: Adult Health Nurse Practitioner

## 2020-03-21 ENCOUNTER — Other Ambulatory Visit: Payer: Self-pay

## 2020-03-21 ENCOUNTER — Ambulatory Visit
Admission: RE | Admit: 2020-03-21 | Discharge: 2020-03-21 | Disposition: A | Discharge status: Home / Self Care | Payer: Medicare Other | Source: Ambulatory Visit | Attending: Adult Health Nurse Practitioner | Admitting: Adult Health Nurse Practitioner

## 2020-03-21 DIAGNOSIS — R1012 Left upper quadrant pain: Secondary | ICD-10-CM

## 2020-09-22 ENCOUNTER — Ambulatory Visit: Payer: Medicare Other | Admitting: Neurology

## 2020-10-31 ENCOUNTER — Other Ambulatory Visit: Payer: Self-pay | Admitting: Adult Health Nurse Practitioner

## 2020-10-31 DIAGNOSIS — M6281 Muscle weakness (generalized): Secondary | ICD-10-CM

## 2020-11-24 ENCOUNTER — Other Ambulatory Visit: Payer: Self-pay

## 2020-11-24 ENCOUNTER — Ambulatory Visit
Admission: RE | Admit: 2020-11-24 | Discharge: 2020-11-24 | Disposition: A | Discharge status: Home / Self Care | Payer: Medicare Other | Source: Ambulatory Visit | Attending: Adult Health Nurse Practitioner | Admitting: Adult Health Nurse Practitioner

## 2020-11-24 DIAGNOSIS — M6281 Muscle weakness (generalized): Secondary | ICD-10-CM

## 2020-12-08 ENCOUNTER — Encounter: Payer: Self-pay | Admitting: Neurology

## 2020-12-08 ENCOUNTER — Ambulatory Visit (INDEPENDENT_AMBULATORY_CARE_PROVIDER_SITE_OTHER): Payer: Medicare Other | Admitting: Neurology

## 2020-12-08 VITALS — BP 146/79 | HR 85

## 2020-12-08 DIAGNOSIS — G959 Disease of spinal cord, unspecified: Secondary | ICD-10-CM

## 2020-12-08 DIAGNOSIS — G894 Chronic pain syndrome: Secondary | ICD-10-CM | POA: Diagnosis not present

## 2020-12-08 NOTE — Patient Instructions (Signed)
Continue current medications Recommend PT, more frequent positioning, turning  See you back in 8 months

## 2020-12-08 NOTE — Progress Notes (Signed)
I have read the note, and I agree with the clinical assessment and plan.  Amaura Authier K Milano Rosevear   
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
## 2021-07-27 ENCOUNTER — Telehealth: Payer: Self-pay | Admitting: Neurology

## 2021-07-27 NOTE — Telephone Encounter (Signed)
Phone number listed was for [facility]. I left a voice mail that her appointment was rescheduled from 12/5 to 12/28 at 10:45am due to Maralyn Sago being out.

## 2021-08-03 ENCOUNTER — Ambulatory Visit: Payer: Medicare Other | Admitting: Neurology

## 2021-08-26 ENCOUNTER — Encounter: Payer: Self-pay | Admitting: Neurology

## 2021-08-26 ENCOUNTER — Ambulatory Visit (INDEPENDENT_AMBULATORY_CARE_PROVIDER_SITE_OTHER): Payer: Medicare Other | Admitting: Neurology

## 2021-08-26 VITALS — BP 127/76 | HR 88

## 2021-08-26 DIAGNOSIS — G894 Chronic pain syndrome: Secondary | ICD-10-CM | POA: Diagnosis not present

## 2021-08-26 DIAGNOSIS — G959 Disease of spinal cord, unspecified: Secondary | ICD-10-CM
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
## 2021-08-26 NOTE — Patient Instructions (Signed)
Great to see you today  Continue current medications Continue to elevate legs, reposition, wear the boot as tolerated  Return here as needed for any new or worsening symptoms

## 2021-10-26 ENCOUNTER — Encounter (INDEPENDENT_AMBULATORY_CARE_PROVIDER_SITE_OTHER): Payer: Self-pay | Admitting: Vascular Surgery

## 2021-10-26 ENCOUNTER — Other Ambulatory Visit: Payer: Self-pay

## 2021-10-26 ENCOUNTER — Ambulatory Visit (INDEPENDENT_AMBULATORY_CARE_PROVIDER_SITE_OTHER): Payer: Medicare Other | Admitting: Vascular Surgery

## 2021-10-26 ENCOUNTER — Telehealth (INDEPENDENT_AMBULATORY_CARE_PROVIDER_SITE_OTHER): Payer: Self-pay

## 2021-10-26 VITALS — BP 123/78 | HR 90 | Resp 18 | Ht 60.0 in | Wt 216.0 lb

## 2021-10-26 DIAGNOSIS — I890 Lymphedema, not elsewhere classified: Secondary | ICD-10-CM | POA: Diagnosis not present

## 2021-10-26 DIAGNOSIS — I872 Venous insufficiency (chronic) (peripheral): Secondary | ICD-10-CM

## 2021-10-26 DIAGNOSIS — I10 Essential (primary) hypertension: Secondary | ICD-10-CM | POA: Diagnosis not present

## 2021-10-26 DIAGNOSIS — K219 Gastro-esophageal reflux disease without esophagitis: Secondary | ICD-10-CM | POA: Diagnosis not present

## 2021-10-26 DIAGNOSIS — E782 Mixed hyperlipidemia: Secondary | ICD-10-CM

## 2021-10-26 NOTE — Progress Notes (Signed)
MRN : [id number]  Brandy Edwards is a 76 y.o. ([birth year]) female who presents with chief complaint of check leg swelling.  History of Present Illness:   The patient returns to the office for followup evaluation regarding leg swelling.  The swelling has persisted and she feels have gotten worse.  Originally they were using the lymph pump twice a day but they now only been doing it once a day.  Also they have only been using it on the left leg.  She states that she only has 1 sleeve.  They have also started Lasix but this is not significantly impacting her lymphedema. There have not been any interval development of a ulcerations or wounds.   The patient denies problems with the pump, noting it is working well and the legging is in good condition.   Since the previous visit the patient has been wearing graduated compression stockings and using the lymph pump on a routine basis and  has noted significant improvement in the lymphedema.      No outpatient medications have been marked as taking for the 10/26/21 encounter (Office Visit) with Gilda Crease, Latina Craver, MD.    Past Medical History:  Diagnosis Date   Acute deep vein thrombosis (DVT) of distal end of left lower extremity (HCC)    Bilateral corneal abrasions    Chronic bronchitis (HCC)    Chronic pain disorder 05/16/2019   Depressed    Dysphagia    Fusion of spine, cervical region    Gout    Hypertension    Injury to ligament of cervical spine 05/2016   Neuropathy     Past Surgical History:  Procedure Laterality Date   CESAREAN SECTION     CHOLECYSTECTOMY     CORPECTOMY C6     TUBAL LIGATION      Social History Social History   Tobacco Use   Smoking status: Never   Smokeless tobacco: Never  Substance Use Topics   Alcohol use: No   Drug use: No    Family History Family History  Problem Relation Age of Onset   Diabetes Mother    Congestive Heart Failure Mother    Hyperlipidemia Father    Hypertension Father     Stroke Father     Allergies  Allergen Reactions   Penicillins Itching    Issue at a young age and accompanied by SOB   Ivp Dye [Iodinated Contrast Media]    Oxycodone-Acetaminophen Nausea And Vomiting     REVIEW OF SYSTEMS (Negative unless checked)  Constitutional: [] Weight loss  [] Fever  [] Chills Cardiac: [] Chest pain   [] Chest pressure   [] Palpitations   [] Shortness of breath when laying flat   [] Shortness of breath with exertion. Vascular:  [] Pain in legs with walking   [] Pain in legs at rest  [] History of DVT   [] Phlebitis   [x] Swelling in legs   [] Varicose veins   [] Non-healing ulcers Pulmonary:   [] Uses home oxygen   [] Productive cough   [] Hemoptysis   [] Wheeze  [] COPD   [] Asthma Neurologic:  [] Dizziness   [] Seizures   [] History of stroke   [] History of TIA  [] Aphasia   [] Vissual changes   [x] Weakness or numbness in arm   [x] Weakness or numbness in leg Musculoskeletal:   [] Joint swelling   [] Joint pain   [] Low back pain Hematologic:  [] Easy bruising  [] Easy bleeding   [] Hypercoagulable state   [] Anemic Gastrointestinal:  [] Diarrhea   [] Vomiting  [x] Gastroesophageal reflux/heartburn   [] Difficulty swallowing. Genitourinary:  []   Chronic kidney disease   [] Difficult urination  [] Frequent urination   [] Blood in urine Skin:  [] Rashes   [] Ulcers  Psychological:  [] History of anxiety   []  History of major depression.  Physical Examination  Vitals:   10/26/21 1038  BP: 123/78  Pulse: 90  Resp: 18  Weight: 216 lb (98 kg)  Height: 5' (1.524 m)   Body mass index is 42.18 kg/m. Gen: WD/WN, NAD seen in a wheelchair Head: [place]/AT, No temporalis wasting.  Ear/Nose/Throat: Hearing grossly intact, nares w/o erythema or drainage, pinna without lesions Eyes: PER, EOMI, sclera nonicteric.  Neck: Supple, no gross masses.  No JVD.  Pulmonary:  Good air movement, no audible wheezing, no use of accessory muscles.  Cardiac: RRR, precordium not hyperdynamic. Vascular:  scattered varicosities  present bilaterally.  Mild venous stasis changes to the legs bilaterally.  3+ soft pitting edema  Vessel Right Left  Radial Palpable Palpable  Gastrointestinal: soft, non-distended. No guarding/no peritoneal signs.  Musculoskeletal: M/S 5/5 throughout.  No deformity.  Neurologic: CN 2-12 intact. Pain and light touch intact in extremities.  Symmetrical.  Speech is fluent. Motor exam as listed above. Psychiatric: Judgment intact, Mood & affect appropriate for pt's clinical situation. Dermatologic: Venous rashes no ulcers noted.  No changes consistent with cellulitis. Lymph : No lichenification or skin changes of chronic lymphedema.  CBC Lab Results  Component Value Date   WBC 12.6 (H) 09/30/2017   HGB 11.9 (L) 09/30/2017   HCT 36.7 09/30/2017   MCV 92.4 09/30/2017   PLT 159 09/30/2017    BMET    Component Value Date/Time   NA 141 09/30/2017 0245   NA 144 12/06/2016 0000   K 4.1 09/30/2017 0245   CL 107 09/30/2017 0245   CO2 23 09/30/2017 0245   GLUCOSE 95 09/30/2017 0245   BUN 10 09/30/2017 0245   BUN 18 12/06/2016 0000   CREATININE 0.69 09/30/2017 0245   CALCIUM 9.1 09/30/2017 0245   GFRNONAA >60 09/30/2017 0245   GFRAA >60 09/30/2017 0245   CrCl cannot be calculated (Patient's most recent lab result is older than the maximum 21 days allowed.).  COAG No results found for: INR, PROTIME  Radiology No results found.   Assessment/Plan 1. Lymphedema  No surgery or intervention at this point in time.    I have reviewed my discussion with the patient regarding lymphedema and why it  causes symptoms.  Patient will continue wearing graduated compression stockings on a daily basis a prescription was given. The patient is reminded to put the stockings on first thing in the morning and removing them in the evening. The patient is instructed specifically not to sleep in the stockings.   In addition, behavioral modification throughout the day will be continued.  This will include  frequent elevation (such as in a recliner), use of over the counter pain medications as needed and exercise such as walking.  I have reviewed systemic causes for chronic edema such as liver, kidney and cardiac etiologies and there does not appear to be any significant changes in these organ systems over the past year.  The patient is under the impression that these organ systems are all stable and unchanged.    The patient will continue aggressive use of the lymph pump.  I have placed in my order sheet for the facility that she should return to twice daily use of the lymph pump.  Furthermore, I will work to obtain a second sleeve so that she can use  the pump for both legs simultaneously.  See #1 this will continue to improve the edema control and prevent sequela such as ulcers and infections.   The patient will follow-up with me on an annual basis.    2. Chronic venous insufficiency See #1  3. Essential hypertension Continue antihypertensive medications as already ordered, these medications have been reviewed and there are no changes at this time.   4. Gastroesophageal reflux disease without esophagitis Continue PPI as already ordered, this medication has been reviewed and there are no changes at this time.  Avoidence of caffeine and alcohol  Moderate elevation of the head of the bed    5. Mixed hyperlipidemia Continue statin as ordered and reviewed, no changes at this time     Levora Dredge, MD  10/26/2021 10:41 AM

## 2021-10-26 NOTE — Telephone Encounter (Signed)
Spoke with Sherlyn Lick at [facility] regarding the patient needing two sleeves for her lymph pump. And when this was ordered the patient was given both sleeves so not sure why only one is being used. Susy Frizzle will call the facility and find out if one was lost and attempt to help.

## 2022-01-07 ENCOUNTER — Inpatient Hospital Stay (HOSPITAL_COMMUNITY)
Admission: EM | Admit: 2022-01-07 | Discharge: 2022-01-12 | DRG: 811 | Disposition: A | Discharge status: Skilled Nursing Facility | Payer: Medicare Other | Source: Skilled Nursing Facility | Attending: Family Medicine | Admitting: Family Medicine

## 2022-01-07 ENCOUNTER — Other Ambulatory Visit: Payer: Self-pay

## 2022-01-07 ENCOUNTER — Encounter (HOSPITAL_COMMUNITY): Payer: Self-pay

## 2022-01-07 DIAGNOSIS — D500 Iron deficiency anemia secondary to blood loss (chronic): Principal | ICD-10-CM | POA: Diagnosis present

## 2022-01-07 DIAGNOSIS — K219 Gastro-esophageal reflux disease without esophagitis: Secondary | ICD-10-CM | POA: Diagnosis present

## 2022-01-07 DIAGNOSIS — Z981 Arthrodesis status: Secondary | ICD-10-CM

## 2022-01-07 DIAGNOSIS — D649 Anemia, unspecified: Principal | ICD-10-CM | POA: Diagnosis present

## 2022-01-07 DIAGNOSIS — Z6841 Body Mass Index (BMI) 40.0 and over, adult: Secondary | ICD-10-CM

## 2022-01-07 DIAGNOSIS — K5730 Diverticulosis of large intestine without perforation or abscess without bleeding: Secondary | ICD-10-CM

## 2022-01-07 DIAGNOSIS — D509 Iron deficiency anemia, unspecified: Secondary | ICD-10-CM

## 2022-01-07 DIAGNOSIS — R55 Syncope and collapse: Secondary | ICD-10-CM | POA: Diagnosis present

## 2022-01-07 DIAGNOSIS — E669 Obesity, unspecified: Secondary | ICD-10-CM | POA: Diagnosis present

## 2022-01-07 DIAGNOSIS — E785 Hyperlipidemia, unspecified: Secondary | ICD-10-CM | POA: Diagnosis present

## 2022-01-07 DIAGNOSIS — Z9049 Acquired absence of other specified parts of digestive tract: Secondary | ICD-10-CM

## 2022-01-07 DIAGNOSIS — Z7951 Long term (current) use of inhaled steroids: Secondary | ICD-10-CM

## 2022-01-07 DIAGNOSIS — I10 Essential (primary) hypertension: Secondary | ICD-10-CM | POA: Diagnosis present

## 2022-01-07 DIAGNOSIS — D62 Acute posthemorrhagic anemia: Secondary | ICD-10-CM | POA: Diagnosis not present

## 2022-01-07 DIAGNOSIS — Z91041 Radiographic dye allergy status: Secondary | ICD-10-CM

## 2022-01-07 DIAGNOSIS — Z79899 Other long term (current) drug therapy: Secondary | ICD-10-CM

## 2022-01-07 DIAGNOSIS — R195 Other fecal abnormalities: Secondary | ICD-10-CM | POA: Diagnosis present

## 2022-01-07 DIAGNOSIS — Z8249 Family history of ischemic heart disease and other diseases of the circulatory system: Secondary | ICD-10-CM

## 2022-01-07 DIAGNOSIS — Y92129 Unspecified place in nursing home as the place of occurrence of the external cause: Secondary | ICD-10-CM

## 2022-01-07 DIAGNOSIS — E876 Hypokalemia: Secondary | ICD-10-CM | POA: Diagnosis not present

## 2022-01-07 DIAGNOSIS — Z83438 Family history of other disorder of lipoprotein metabolism and other lipidemia: Secondary | ICD-10-CM

## 2022-01-07 DIAGNOSIS — Z7901 Long term (current) use of anticoagulants: Secondary | ICD-10-CM

## 2022-01-07 DIAGNOSIS — Z885 Allergy status to narcotic agent status: Secondary | ICD-10-CM

## 2022-01-07 DIAGNOSIS — M109 Gout, unspecified: Secondary | ICD-10-CM | POA: Diagnosis present

## 2022-01-07 DIAGNOSIS — Z880 Allergy status to penicillin: Secondary | ICD-10-CM

## 2022-01-07 DIAGNOSIS — I890 Lymphedema, not elsewhere classified: Secondary | ICD-10-CM

## 2022-01-07 DIAGNOSIS — G40909 Epilepsy, unspecified, not intractable, without status epilepticus: Secondary | ICD-10-CM | POA: Diagnosis present

## 2022-01-07 DIAGNOSIS — G894 Chronic pain syndrome: Secondary | ICD-10-CM | POA: Diagnosis present

## 2022-01-07 DIAGNOSIS — S20219A Contusion of unspecified front wall of thorax, initial encounter: Secondary | ICD-10-CM | POA: Diagnosis present

## 2022-01-07 DIAGNOSIS — Z9851 Tubal ligation status: Secondary | ICD-10-CM

## 2022-01-07 DIAGNOSIS — G8250 Quadriplegia, unspecified: Secondary | ICD-10-CM | POA: Diagnosis present

## 2022-01-07 DIAGNOSIS — K6389 Other specified diseases of intestine: Secondary | ICD-10-CM | POA: Diagnosis present

## 2022-01-07 DIAGNOSIS — Z86718 Personal history of other venous thrombosis and embolism: Secondary | ICD-10-CM

## 2022-01-07 DIAGNOSIS — J42 Unspecified chronic bronchitis: Secondary | ICD-10-CM | POA: Diagnosis present

## 2022-01-07 LAB — IRON AND TIBC
Iron: 28 ug/dL (ref 28–170)
Saturation Ratios: 10 % — ABNORMAL LOW (ref 10.4–31.8)
TIBC: 293 ug/dL (ref 250–450)
UIBC: 265 ug/dL

## 2022-01-07 LAB — COMPREHENSIVE METABOLIC PANEL
ALT: 15 U/L (ref 0–44)
AST: 19 U/L (ref 15–41)
Albumin: 2.9 g/dL — ABNORMAL LOW (ref 3.5–5.0)
Alkaline Phosphatase: 65 U/L (ref 38–126)
Anion gap: 11 (ref 5–15)
BUN: 13 mg/dL (ref 8–23)
CO2: 30 mmol/L (ref 22–32)
Calcium: 8.7 mg/dL — ABNORMAL LOW (ref 8.9–10.3)
Chloride: 98 mmol/L (ref 98–111)
Creatinine, Ser: 0.76 mg/dL (ref 0.44–1.00)
GFR, Estimated: >60 mL/min (ref >60)
Glucose, Bld: 127 mg/dL — ABNORMAL HIGH (ref 70–99)
Potassium: 3.1 mmol/L — ABNORMAL LOW (ref 3.5–5.1)
Sodium: 139 mmol/L (ref 135–145)
Total Bilirubin: 0.9 mg/dL (ref 0.3–1.2)
Total Protein: 6.2 g/dL — ABNORMAL LOW (ref 6.5–8.1)

## 2022-01-07 LAB — CBC
HCT: 23.5 % — ABNORMAL LOW (ref 36.0–46.0)
Hemoglobin: 7.6 g/dL — ABNORMAL LOW (ref 12.0–15.0)
MCH: 28 pg (ref 26.0–34.0)
MCHC: 32.3 g/dL (ref 30.0–36.0)
MCV: 86.7 fL (ref 80.0–100.0)
Platelets: 205 10*3/uL (ref 150–400)
RBC: 2.71 MIL/uL — ABNORMAL LOW (ref 3.87–5.11)
RDW: 17.1 % — ABNORMAL HIGH (ref 11.5–15.5)
WBC: 9.8 10*3/uL (ref 4.0–10.5)
nRBC: 1.1 % — ABNORMAL HIGH (ref 0.0–0.2)

## 2022-01-07 LAB — RETICULOCYTES
Immature Retic Fract: 43.3 % — ABNORMAL HIGH (ref 2.3–15.9)
RBC.: 1.84 MIL/uL — ABNORMAL LOW (ref 3.87–5.11)
Retic Count, Absolute: 95.3 10*3/uL (ref 19.0–186.0)
Retic Ct Pct: 5.2 % — ABNORMAL HIGH (ref 0.4–3.1)

## 2022-01-07 LAB — I-STAT CHEM 8, ED
BUN: 15 mg/dL (ref 8–23)
Calcium, Ion: 1.05 mmol/L — ABNORMAL LOW (ref 1.15–1.40)
Chloride: 97 mmol/L — ABNORMAL LOW (ref 98–111)
Creatinine, Ser: 0.8 mg/dL (ref 0.44–1.00)
Glucose, Bld: 129 mg/dL — ABNORMAL HIGH (ref 70–99)
HCT: 18 % — ABNORMAL LOW (ref 36.0–46.0)
Hemoglobin: 6.1 g/dL — CL (ref 12.0–15.0)
Potassium: 3.1 mmol/L — ABNORMAL LOW (ref 3.5–5.1)
Sodium: 138 mmol/L (ref 135–145)
TCO2: 33 mmol/L — ABNORMAL HIGH (ref 22–32)

## 2022-01-07 LAB — PROTIME-INR
INR: 4 — ABNORMAL HIGH (ref 0.8–1.2)
Prothrombin Time: 38.7 seconds — ABNORMAL HIGH (ref 11.4–15.2)

## 2022-01-07 LAB — CBC WITH DIFFERENTIAL/PLATELET
Abs Immature Granulocytes: 0.1 10*3/uL — ABNORMAL HIGH (ref 0.00–0.07)
Basophils Absolute: 0 10*3/uL (ref 0.0–0.1)
Basophils Relative: 0 %
Eosinophils Absolute: 0.3 10*3/uL (ref 0.0–0.5)
Eosinophils Relative: 3 %
HCT: 17.2 % — ABNORMAL LOW (ref 36.0–46.0)
Hemoglobin: 5.2 g/dL — CL (ref 12.0–15.0)
Immature Granulocytes: 1 %
Lymphocytes Relative: 24 %
Lymphs Abs: 2.4 10*3/uL (ref 0.7–4.0)
MCH: 26.8 pg (ref 26.0–34.0)
MCHC: 30.2 g/dL (ref 30.0–36.0)
MCV: 88.7 fL (ref 80.0–100.0)
Monocytes Absolute: 0.6 10*3/uL (ref 0.1–1.0)
Monocytes Relative: 6 %
Neutro Abs: 6.7 10*3/uL (ref 1.7–7.7)
Neutrophils Relative %: 66 %
Platelets: 221 10*3/uL (ref 150–400)
RBC: 1.94 MIL/uL — ABNORMAL LOW (ref 3.87–5.11)
RDW: 18.9 % — ABNORMAL HIGH (ref 11.5–15.5)
WBC: 10.1 10*3/uL (ref 4.0–10.5)
nRBC: 0.6 % — ABNORMAL HIGH (ref 0.0–0.2)

## 2022-01-07 LAB — ABO/RH: ABO/RH(D): B NEG

## 2022-01-07 LAB — FOLATE: Folate: 11.3 ng/mL (ref >5.9)

## 2022-01-07 LAB — FERRITIN: Ferritin: 21 ng/mL (ref 11–307)

## 2022-01-07 LAB — PREPARE RBC (CROSSMATCH)

## 2022-01-07 LAB — VITAMIN B12: Vitamin B-12: 322 pg/mL (ref 180–914)

## 2022-01-07 LAB — POC OCCULT BLOOD, ED: Fecal Occult Bld: NEGATIVE

## 2022-01-07 MED ORDER — MORPHINE SULFATE (PF) 2 MG/ML IV SOLN: 2.0000 mg | INTRAVENOUS | Status: DC | PRN

## 2022-01-07 MED ORDER — PREGABALIN 75 MG PO CAPS
75.0000 mg | ORAL_CAPSULE | Freq: Two times a day (BID) | ORAL | Status: DC
  Administered 2022-01-07: 50 mg via ORAL
  Administered 2022-01-07 – 2022-01-12 (×9): 75 mg via ORAL
  Filled 2022-01-07 (×2): qty 1
  Filled 2022-01-07: qty 3
  Filled 2022-01-07 (×7): qty 1

## 2022-01-07 MED ORDER — CYCLOSPORINE 0.05 % OP EMUL
1.0000 [drp] | Freq: Two times a day (BID) | OPHTHALMIC | Status: DC
  Administered 2022-01-07 – 2022-01-12 (×10): 1 [drp] via OPHTHALMIC
  Filled 2022-01-07 (×12): qty 30

## 2022-01-07 MED ORDER — ALLOPURINOL 100 MG PO TABS
100.0000 mg | ORAL_TABLET | Freq: Every day | ORAL | Status: DC
  Administered 2022-01-07 – 2022-01-12 (×5): 100 mg via ORAL
  Filled 2022-01-07 (×5): qty 1

## 2022-01-07 MED ORDER — MELATONIN 10 MG PO TABS: 10.0000 mg | ORAL_TABLET | Freq: Every day | ORAL | Status: DC

## 2022-01-07 MED ORDER — ONDANSETRON HCL 4 MG PO TABS
4.0000 mg | ORAL_TABLET | Freq: Four times a day (QID) | ORAL | Status: DC | PRN
Start: 2022-01-07 — End: 2022-01-12

## 2022-01-07 MED ORDER — PANTOPRAZOLE SODIUM 40 MG IV SOLR
40.0000 mg | Freq: Two times a day (BID) | INTRAVENOUS | Status: DC
  Administered 2022-01-07 – 2022-01-12 (×11): 40 mg via INTRAVENOUS
  Filled 2022-01-07 (×11): qty 10

## 2022-01-07 MED ORDER — MUSCLE RUB 10-15 % EX CREA
TOPICAL_CREAM | Freq: Every day | CUTANEOUS | Status: DC
  Filled 2022-01-07 (×2): qty 85

## 2022-01-07 MED ORDER — ONDANSETRON HCL 4 MG/2ML IJ SOLN: 4.0000 mg | Freq: Four times a day (QID) | INTRAMUSCULAR | Status: DC | PRN

## 2022-01-07 MED ORDER — LEVETIRACETAM 500 MG PO TABS: 500.0000 mg | ORAL_TABLET | ORAL | Status: DC

## 2022-01-07 MED ORDER — AMLODIPINE BESYLATE 10 MG PO TABS
10.0000 mg | ORAL_TABLET | Freq: Every day | ORAL | Status: DC
  Administered 2022-01-08 – 2022-01-12 (×4): 10 mg via ORAL
  Filled 2022-01-07 (×4): qty 1

## 2022-01-07 MED ORDER — ACETAMINOPHEN 650 MG RE SUPP: 650.0000 mg | Freq: Four times a day (QID) | RECTAL | Status: DC | PRN

## 2022-01-07 MED ORDER — ATORVASTATIN CALCIUM 80 MG PO TABS
80.0000 mg | ORAL_TABLET | Freq: Every day | ORAL | Status: DC
  Administered 2022-01-07 – 2022-01-11 (×5): 80 mg via ORAL
  Filled 2022-01-07 (×5): qty 1

## 2022-01-07 MED ORDER — TRAMADOL-ACETAMINOPHEN 37.5-325 MG PO TABS
2.0000 | ORAL_TABLET | Freq: Three times a day (TID) | ORAL | Status: DC
  Administered 2022-01-07 – 2022-01-12 (×15): 2 via ORAL
  Filled 2022-01-07 (×16): qty 2

## 2022-01-07 MED ORDER — SODIUM CHLORIDE 0.9% FLUSH
3.0000 mL | Freq: Two times a day (BID) | INTRAVENOUS | Status: DC
  Administered 2022-01-07 – 2022-01-12 (×6): 3 mL via INTRAVENOUS

## 2022-01-07 MED ORDER — LEVETIRACETAM 500 MG PO TABS
500.0000 mg | ORAL_TABLET | Freq: Every day | ORAL | Status: DC
Start: 2022-01-07 — End: 2022-01-12
  Administered 2022-01-07 – 2022-01-12 (×5): 500 mg via ORAL
  Filled 2022-01-07 (×5): qty 1

## 2022-01-07 MED ORDER — FLUTICASONE PROPIONATE 50 MCG/ACT NA SUSP
2.0000 | Freq: Every day | NASAL | Status: DC
  Administered 2022-01-08 – 2022-01-12 (×4): 2 via NASAL
  Filled 2022-01-07: qty 16

## 2022-01-07 MED ORDER — CHLORHEXIDINE GLUCONATE 0.12 % MT SOLN
30.0000 mL | Freq: Two times a day (BID) | OROMUCOSAL | Status: DC
  Administered 2022-01-07 – 2022-01-12 (×8): 30 mL via OROMUCOSAL
  Filled 2022-01-07 (×10): qty 30

## 2022-01-07 MED ORDER — ACETAMINOPHEN 325 MG PO TABS: 650.0000 mg | ORAL_TABLET | Freq: Four times a day (QID) | ORAL | Status: DC | PRN

## 2022-01-07 MED ORDER — LEVETIRACETAM 500 MG PO TABS
1000.0000 mg | ORAL_TABLET | Freq: Every day | ORAL | Status: DC
  Administered 2022-01-07 – 2022-01-11 (×5): 1000 mg via ORAL
  Filled 2022-01-07 (×5): qty 2

## 2022-01-07 MED ORDER — BUDESONIDE 0.5 MG/2ML IN SUSP
0.5000 mg | Freq: Two times a day (BID) | RESPIRATORY_TRACT | Status: DC
  Administered 2022-01-07 – 2022-01-12 (×9): 0.5 mg via RESPIRATORY_TRACT
  Filled 2022-01-07 (×12): qty 2

## 2022-01-07 MED ORDER — GUAIFENESIN ER 600 MG PO TB12
600.0000 mg | ORAL_TABLET | Freq: Two times a day (BID) | ORAL | Status: DC
  Administered 2022-01-07 – 2022-01-12 (×10): 600 mg via ORAL
  Filled 2022-01-07 (×10): qty 1

## 2022-01-07 MED ORDER — MELATONIN 5 MG PO TABS
10.0000 mg | ORAL_TABLET | Freq: Every day | ORAL | Status: DC
  Administered 2022-01-07 – 2022-01-11 (×5): 10 mg via ORAL
  Filled 2022-01-07 (×6): qty 2

## 2022-01-07 MED ORDER — EZETIMIBE 10 MG PO TABS
10.0000 mg | ORAL_TABLET | Freq: Every day | ORAL | Status: DC
Start: 2022-01-07 — End: 2022-01-12
  Administered 2022-01-07 – 2022-01-12 (×5): 10 mg via ORAL
  Filled 2022-01-07 (×6): qty 1

## 2022-01-07 MED ORDER — SODIUM CHLORIDE 0.9% IV SOLUTION: Freq: Once | INTRAVENOUS | Status: DC

## 2022-01-07 MED ORDER — MONTELUKAST SODIUM 10 MG PO TABS
10.0000 mg | ORAL_TABLET | Freq: Every day | ORAL | Status: DC
  Administered 2022-01-07 – 2022-01-11 (×5): 10 mg via ORAL
  Filled 2022-01-07 (×5): qty 1

## 2022-01-07 MED ORDER — MENTHOL (TOPICAL ANALGESIC) 4 % EX GEL: 1.0000 "application " | Freq: Every day | CUTANEOUS | Status: DC

## 2022-01-07 MED ORDER — HYDRALAZINE HCL 20 MG/ML IJ SOLN: 5.0000 mg | INTRAMUSCULAR | Status: DC | PRN

## 2022-01-07 MED ORDER — BACLOFEN 10 MG PO TABS
20.0000 mg | ORAL_TABLET | Freq: Four times a day (QID) | ORAL | Status: DC
  Administered 2022-01-07 – 2022-01-12 (×19): 20 mg via ORAL
  Filled 2022-01-07 (×5): qty 2
  Filled 2022-01-07 (×2): qty 1
  Filled 2022-01-07 (×9): qty 2
  Filled 2022-01-07: qty 1
  Filled 2022-01-07: qty 2
  Filled 2022-01-07: qty 1
  Filled 2022-01-07 (×4): qty 2

## 2022-01-07 MED ORDER — GUAIFENESIN 100 MG/5ML PO LIQD
300.0000 mg | Freq: Three times a day (TID) | ORAL | Status: DC | PRN
  Administered 2022-01-08: 300 mg via ORAL
  Filled 2022-01-07 (×2): qty 15

## 2022-01-07 MED ORDER — LACTATED RINGERS IV SOLN: INTRAVENOUS | Status: DC
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
## 2022-01-07 NOTE — ED Triage Notes (Signed)
Pt BIB EMS from [facility] for weakness and her facility sent her for low hemoglobin, however did not know the value. No pain and alert &oriented  ? ?124/80 ?100%  ?84 Hr   ?

## 2022-01-07 NOTE — Progress Notes (Signed)
NEW ADMISSION NOTE ?New Admission Note:  ? ?Arrival Method: stretcher ?Mental Orientation: A&OX4 ?Telemetry:5M06 ?Assessment: Completed ?Skin: intact bruising on R breast, left flank ?IV: LAC ?Pain:8/10 ?Tubes: NONE ?Safety Measures: Safety Fall Prevention Plan has been given, discussed and signed ?Admission: Completed ?[facility] Orientation: Patient has been orientated to the room, unit and staff.  ?Family:none at bedside ? ?Orders have been reviewed and implemented. Will continue to monitor the patient. Call light has been placed within reach and bed alarm has been activated.  ? ?Angeline Trick S Jolyn Deshmukh, RN   ?

## 2022-01-07 NOTE — TOC Progression Note (Signed)
Transition of Care (TOC) - Initial/Assessment Note  ? ? ?Patient Details  ?Name: Brandy Edwards ?MRN: [id number] ?Date of Birth: 06-07-1946 ? ?Transition of Care (TOC) CM/SW Contact:    ?Catalina Pizza Shivaun Bilello, LCSWA ?Phone Number: ?01/07/2022, 4:55 PM ? ?Clinical Narrative:                 ?CSW contacted Marcelino Duster with admissions at [facility] to inquire about whether the patient is a LT or ST resident and is awaiting a response.   ? ?TOC will continue to follow. ? ?  ?  ? ? ?Patient Goals and CMS Choice ?  ?  ?  ? ?Expected Discharge Plan and Services ?  ?  ?  ?  ?  ?                ?  ?  ?  ?  ?  ?  ?  ?  ?  ?  ? ?Prior Living Arrangements/Services ?  ?  ?  ?       ?  ?  ?  ?  ? ?Activities of Daily Living ?Home Assistive Devices/Equipment: Wheelchair, Nurse, adult ?ADL Screening (condition at time of admission) ?Patient's cognitive ability adequate to safely complete daily activities?: Yes ?Is the patient deaf or have difficulty hearing?: No ?Does the patient have difficulty seeing, even when wearing glasses/contacts?: No ?Does the patient have difficulty concentrating, remembering, or making decisions?: No ?Patient able to express need for assistance with ADLs?: Yes ?Does the patient have difficulty dressing or bathing?: Yes ?Independently performs ADLs?: No ?Communication: Independent ?Dressing (OT): Dependent ?Is this a change from baseline?: Pre-admission baseline ?Grooming: Dependent ?Is this a change from baseline?: Pre-admission baseline ?Feeding: Dependent ?Is this a change from baseline?: Pre-admission baseline ?Bathing: Dependent ?Is this a change from baseline?: Pre-admission baseline ?Toileting: Dependent ?Is this a change from baseline?: Pre-admission baseline ?In/Out Bed: Dependent ?Is this a change from baseline?: Pre-admission baseline ?Does the patient have difficulty walking or climbing stairs?: Yes ?Weakness of Legs: Both ?Weakness of Arms/Hands: Both ? ?Permission Sought/Granted ?  ?   ?   ?   ?   ?   ? ?Emotional Assessment ?  ?  ?  ?  ?  ?  ? ?Admission diagnosis:  Symptomatic anemia [D64.9] ?Anemia, unspecified type [D64.9] ?Patient Active Problem List  ? Diagnosis Date Noted  ? Symptomatic anemia 01/07/2022  ? Occult GI bleeding 01/07/2022  ? Quadriplegia (HCC) 01/07/2022  ? History of DVT (deep vein thrombosis) 01/07/2022  ? Chronic venous insufficiency 10/26/2021  ? Chronic pain disorder 05/16/2019  ? Lymphedema 04/11/2018  ? Bilateral lower extremity edema 03/08/2018  ? Left leg pain 03/08/2018  ? Impacted cerumen of both ears 12/14/2017  ? Sensorineural hearing loss (SNHL), bilateral 12/14/2017  ? Symptomatic urinary tract infection   ? Acute bronchitis   ? Acute encephalopathy 09/29/2017  ? Dysuria 09/29/2017  ? Non-productive cough 09/29/2017  ? Falls 09/20/2017  ? Muscle weakness 09/20/2017  ? Hypertensive disorder 09/20/2017  ? Chronic allergic rhinitis 11/30/2016  ? Muscle spasticity 11/30/2016  ? Gastroesophageal reflux disease without esophagitis 11/30/2016  ? Chronic constipation 11/30/2016  ? Hyperlipidemia 11/30/2016  ? Cervical myelopathy (HCC) 11/30/2016  ? Essential hypertension 09/23/2016  ? Depression 09/23/2016  ? Neuropathy 09/23/2016  ? Insomnia 09/23/2016  ? Acute deep vein thrombosis (DVT) of distal end of left lower extremity (HCC) 06/14/2016  ? Blood loss anemia 06/14/2016  ? Bilateral corneal abrasions 06/03/2016  ?  Chest pain 06/03/2016  ? Contusion of abdominal wall 06/03/2016  ? Fusion of spine 06/03/2016  ? Hematoma of frontal scalp 06/03/2016  ? Hypokalemia 06/03/2016  ? Obesity 06/03/2016  ? Palpitations 06/03/2016  ? Prediabetes 06/03/2016  ? Status post cervical arthrodesis 06/03/2016  ? ?PCP:  Pcp, No ?Pharmacy:   ?[facility] - [place], [place] - [address] ?[address] ?[address] ?[place] [address] ?Phone: [phone] Fax: [phone] ? ? ? ? ?Social Determinants of Health (SDOH) Interventions ?  ? ?Readmission Risk Interventions ?   ? View : No data  to display.  ?  ?  ?  ? ? ? ?

## 2022-01-07 NOTE — ED Notes (Signed)
EDP notified that the pt stated that she had a positive hemacult at her PCP the other day  ?
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
## 2022-01-08 DIAGNOSIS — Z7951 Long term (current) use of inhaled steroids: Secondary | ICD-10-CM | POA: Diagnosis not present

## 2022-01-08 DIAGNOSIS — D509 Iron deficiency anemia, unspecified: Secondary | ICD-10-CM | POA: Diagnosis not present

## 2022-01-08 DIAGNOSIS — G894 Chronic pain syndrome: Secondary | ICD-10-CM | POA: Diagnosis present

## 2022-01-08 DIAGNOSIS — M109 Gout, unspecified: Secondary | ICD-10-CM | POA: Diagnosis present

## 2022-01-08 DIAGNOSIS — G8250 Quadriplegia, unspecified: Secondary | ICD-10-CM | POA: Diagnosis present

## 2022-01-08 DIAGNOSIS — K219 Gastro-esophageal reflux disease without esophagitis: Secondary | ICD-10-CM | POA: Diagnosis present

## 2022-01-08 DIAGNOSIS — I10 Essential (primary) hypertension: Secondary | ICD-10-CM | POA: Diagnosis present

## 2022-01-08 DIAGNOSIS — G40909 Epilepsy, unspecified, not intractable, without status epilepticus: Secondary | ICD-10-CM | POA: Diagnosis present

## 2022-01-08 DIAGNOSIS — J42 Unspecified chronic bronchitis: Secondary | ICD-10-CM | POA: Diagnosis present

## 2022-01-08 DIAGNOSIS — Z7901 Long term (current) use of anticoagulants: Secondary | ICD-10-CM | POA: Diagnosis not present

## 2022-01-08 DIAGNOSIS — K6389 Other specified diseases of intestine: Secondary | ICD-10-CM | POA: Diagnosis present

## 2022-01-08 DIAGNOSIS — K5730 Diverticulosis of large intestine without perforation or abscess without bleeding: Secondary | ICD-10-CM | POA: Diagnosis present

## 2022-01-08 DIAGNOSIS — R55 Syncope and collapse: Secondary | ICD-10-CM | POA: Diagnosis present

## 2022-01-08 DIAGNOSIS — Z8249 Family history of ischemic heart disease and other diseases of the circulatory system: Secondary | ICD-10-CM | POA: Diagnosis not present

## 2022-01-08 DIAGNOSIS — Y92129 Unspecified place in nursing home as the place of occurrence of the external cause: Secondary | ICD-10-CM | POA: Diagnosis not present

## 2022-01-08 DIAGNOSIS — Z79899 Other long term (current) drug therapy: Secondary | ICD-10-CM | POA: Diagnosis not present

## 2022-01-08 DIAGNOSIS — Z6841 Body Mass Index (BMI) 40.0 and over, adult: Secondary | ICD-10-CM | POA: Diagnosis not present

## 2022-01-08 DIAGNOSIS — Z86718 Personal history of other venous thrombosis and embolism: Secondary | ICD-10-CM | POA: Diagnosis not present

## 2022-01-08 DIAGNOSIS — D649 Anemia, unspecified: Secondary | ICD-10-CM | POA: Diagnosis present

## 2022-01-08 DIAGNOSIS — J449 Chronic obstructive pulmonary disease, unspecified: Secondary | ICD-10-CM | POA: Diagnosis not present

## 2022-01-08 DIAGNOSIS — S20219A Contusion of unspecified front wall of thorax, initial encounter: Secondary | ICD-10-CM | POA: Diagnosis present

## 2022-01-08 DIAGNOSIS — D500 Iron deficiency anemia secondary to blood loss (chronic): Secondary | ICD-10-CM | POA: Diagnosis present

## 2022-01-08 DIAGNOSIS — Z981 Arthrodesis status: Secondary | ICD-10-CM | POA: Diagnosis not present

## 2022-01-08 DIAGNOSIS — I82409 Acute embolism and thrombosis of unspecified deep veins of unspecified lower extremity: Secondary | ICD-10-CM | POA: Diagnosis not present

## 2022-01-08 DIAGNOSIS — E785 Hyperlipidemia, unspecified: Secondary | ICD-10-CM | POA: Diagnosis present

## 2022-01-08 DIAGNOSIS — I890 Lymphedema, not elsewhere classified: Secondary | ICD-10-CM | POA: Diagnosis present

## 2022-01-08 DIAGNOSIS — R195 Other fecal abnormalities: Secondary | ICD-10-CM | POA: Diagnosis present

## 2022-01-08 DIAGNOSIS — Z83438 Family history of other disorder of lipoprotein metabolism and other lipidemia: Secondary | ICD-10-CM | POA: Diagnosis not present

## 2022-01-08 LAB — BASIC METABOLIC PANEL
Anion gap: 6 (ref 5–15)
BUN: 9 mg/dL (ref 8–23)
CO2: 31 mmol/L (ref 22–32)
Calcium: 8.3 mg/dL — ABNORMAL LOW (ref 8.9–10.3)
Chloride: 105 mmol/L (ref 98–111)
Creatinine, Ser: 0.65 mg/dL (ref 0.44–1.00)
GFR, Estimated: >60 mL/min (ref >60)
Glucose, Bld: 85 mg/dL (ref 70–99)
Potassium: 3.2 mmol/L — ABNORMAL LOW (ref 3.5–5.1)
Sodium: 142 mmol/L (ref 135–145)

## 2022-01-08 LAB — TYPE AND SCREEN
ABO/RH(D): B NEG
Antibody Screen: NEGATIVE
Unit division: 0
Unit division: 0
Unit division: 0

## 2022-01-08 LAB — CBC
HCT: 22.8 % — ABNORMAL LOW (ref 36.0–46.0)
HCT: 25.7 % — ABNORMAL LOW (ref 36.0–46.0)
Hemoglobin: 7.4 g/dL — ABNORMAL LOW (ref 12.0–15.0)
Hemoglobin: 8.4 g/dL — ABNORMAL LOW (ref 12.0–15.0)
MCH: 28.4 pg (ref 26.0–34.0)
MCH: 29 pg (ref 26.0–34.0)
MCHC: 32.5 g/dL (ref 30.0–36.0)
MCHC: 32.7 g/dL (ref 30.0–36.0)
MCV: 87.4 fL (ref 80.0–100.0)
MCV: 88.6 fL (ref 80.0–100.0)
Platelets: 193 10*3/uL (ref 150–400)
Platelets: 233 10*3/uL (ref 150–400)
RBC: 2.61 MIL/uL — ABNORMAL LOW (ref 3.87–5.11)
RBC: 2.9 MIL/uL — ABNORMAL LOW (ref 3.87–5.11)
RDW: 17.7 % — ABNORMAL HIGH (ref 11.5–15.5)
RDW: 17.8 % — ABNORMAL HIGH (ref 11.5–15.5)
WBC: 7.5 10*3/uL (ref 4.0–10.5)
WBC: 10.2 10*3/uL (ref 4.0–10.5)
nRBC: 0.7 % — ABNORMAL HIGH (ref 0.0–0.2)
nRBC: 0.7 % — ABNORMAL HIGH (ref 0.0–0.2)

## 2022-01-08 LAB — BPAM RBC
Blood Product Expiration Date: 202305162359
Blood Product Expiration Date: 202305162359
Blood Product Expiration Date: 202306122359
ISSUE DATE / TIME: 202305110554
ISSUE DATE / TIME: 202305111001
Unit Type and Rh: 1700
Unit Type and Rh: 1700
Unit Type and Rh: 1700

## 2022-01-08 LAB — PROTIME-INR
INR: 1.9 — ABNORMAL HIGH (ref 0.8–1.2)
Prothrombin Time: 21.4 seconds — ABNORMAL HIGH (ref 11.4–15.2)

## 2022-01-08 MED ORDER — POLYETHYLENE GLYCOL 3350 17 G PO PACK
17.0000 g | PACK | Freq: Once | ORAL | Status: AC
  Administered 2022-01-08: 17 g via ORAL
  Filled 2022-01-08: qty 1

## 2022-01-08 MED ORDER — POTASSIUM CHLORIDE CRYS ER 20 MEQ PO TBCR
40.0000 meq | EXTENDED_RELEASE_TABLET | Freq: Once | ORAL | Status: AC
  Administered 2022-01-08: 40 meq via ORAL
  Filled 2022-01-08: qty 2
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
## 2022-01-08 NOTE — Care Management Obs Status (Signed)
MEDICARE OBSERVATION STATUS NOTIFICATION ? ? ?Patient Details  ?Name: Brandy Edwards ?MRN: [id number] ?Date of Birth: June 12, 1946 ? ? ?Medicare Observation Status Notification Given:  Yes ? ? ? ?Tom-Johnson, Renea Ee, RN ?01/08/2022, 1:18 PM ?

## 2022-01-08 NOTE — TOC Progression Note (Signed)
Transition of Care (TOC) - Initial/Assessment Note  ? ? ?Patient Details  ?Name: Brandy Edwards ?MRN: [id number] ?Date of Birth: October 20, 1945 ? ?Transition of Care (TOC) CM/SW Contact:    ?Catalina Pizza Carlus Stay, LCSWA ?Phone Number: ?01/08/2022, 9:10 AM ? ?Clinical Narrative:                 ?Marcelino Duster with admissions at [facility] verified that the patient is a LT resident at the facility and can return when medically ready. ? ?TOC will continue to follow. ? ?  ?  ? ? ?Patient Goals and CMS Choice ?  ?  ?  ? ?Expected Discharge Plan and Services ?  ?  ?  ?  ?  ?                ?  ?  ?  ?  ?  ?  ?  ?  ?  ?  ? ?Prior Living Arrangements/Services ?  ?  ?  ?       ?  ?  ?  ?  ? ?Activities of Daily Living ?Home Assistive Devices/Equipment: Wheelchair, Nurse, adult ?ADL Screening (condition at time of admission) ?Patient's cognitive ability adequate to safely complete daily activities?: Yes ?Is the patient deaf or have difficulty hearing?: No ?Does the patient have difficulty seeing, even when wearing glasses/contacts?: No ?Does the patient have difficulty concentrating, remembering, or making decisions?: No ?Patient able to express need for assistance with ADLs?: Yes ?Does the patient have difficulty dressing or bathing?: Yes ?Independently performs ADLs?: No ?Communication: Independent ?Dressing (OT): Dependent ?Is this a change from baseline?: Pre-admission baseline ?Grooming: Dependent ?Is this a change from baseline?: Pre-admission baseline ?Feeding: Dependent ?Is this a change from baseline?: Pre-admission baseline ?Bathing: Dependent ?Is this a change from baseline?: Pre-admission baseline ?Toileting: Dependent ?Is this a change from baseline?: Pre-admission baseline ?In/Out Bed: Dependent ?Is this a change from baseline?: Pre-admission baseline ?Does the patient have difficulty walking or climbing stairs?: Yes ?Weakness of Legs: Both ?Weakness of Arms/Hands: Both ? ?Permission Sought/Granted ?  ?  ?   ?   ?   ?    ? ?Emotional Assessment ?  ?  ?  ?  ?  ?  ? ?Admission diagnosis:  Symptomatic anemia [D64.9] ?Anemia, unspecified type [D64.9] ?Patient Active Problem List  ? Diagnosis Date Noted  ? Symptomatic anemia 01/07/2022  ? Occult GI bleeding 01/07/2022  ? Quadriplegia (HCC) 01/07/2022  ? History of DVT (deep vein thrombosis) 01/07/2022  ? Chronic venous insufficiency 10/26/2021  ? Chronic pain disorder 05/16/2019  ? Lymphedema 04/11/2018  ? Bilateral lower extremity edema 03/08/2018  ? Left leg pain 03/08/2018  ? Impacted cerumen of both ears 12/14/2017  ? Sensorineural hearing loss (SNHL), bilateral 12/14/2017  ? Symptomatic urinary tract infection   ? Acute bronchitis   ? Acute encephalopathy 09/29/2017  ? Dysuria 09/29/2017  ? Non-productive cough 09/29/2017  ? Falls 09/20/2017  ? Muscle weakness 09/20/2017  ? Hypertensive disorder 09/20/2017  ? Chronic allergic rhinitis 11/30/2016  ? Muscle spasticity 11/30/2016  ? Gastroesophageal reflux disease without esophagitis 11/30/2016  ? Chronic constipation 11/30/2016  ? Hyperlipidemia 11/30/2016  ? Cervical myelopathy (HCC) 11/30/2016  ? Essential hypertension 09/23/2016  ? Depression 09/23/2016  ? Neuropathy 09/23/2016  ? Insomnia 09/23/2016  ? Acute deep vein thrombosis (DVT) of distal end of left lower extremity (HCC) 06/14/2016  ? Blood loss anemia 06/14/2016  ? Bilateral corneal abrasions 06/03/2016  ? Chest pain 06/03/2016  ? Contusion  of abdominal wall 06/03/2016  ? Fusion of spine 06/03/2016  ? Hematoma of frontal scalp 06/03/2016  ? Hypokalemia 06/03/2016  ? Obesity 06/03/2016  ? Palpitations 06/03/2016  ? Prediabetes 06/03/2016  ? Status post cervical arthrodesis 06/03/2016  ? ?PCP:  Pcp, No ?Pharmacy:   ?[place], [place] ?[address] ?[place] [address] ?Phone: [phone] Fax: [phone] ? ? ? ? ?Social Determinants of Health (SDOH) Interventions ?  ? ?Readmission Risk Interventions ?   ? View : No data to display.  ?  ?   ?  ? ? ? ?
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
## 2022-01-09 ENCOUNTER — Inpatient Hospital Stay (HOSPITAL_COMMUNITY): Payer: Medicare Other | Admitting: Certified Registered"

## 2022-01-09 ENCOUNTER — Encounter (HOSPITAL_COMMUNITY)
Admission: EM | Disposition: A | Discharge status: Skilled Nursing Facility | Payer: Self-pay | Source: Skilled Nursing Facility | Attending: Family Medicine

## 2022-01-09 ENCOUNTER — Encounter (HOSPITAL_COMMUNITY): Payer: Self-pay | Admitting: Family Medicine

## 2022-01-09 DIAGNOSIS — I10 Essential (primary) hypertension: Secondary | ICD-10-CM

## 2022-01-09 DIAGNOSIS — D649 Anemia, unspecified: Secondary | ICD-10-CM | POA: Diagnosis not present

## 2022-01-09 DIAGNOSIS — J449 Chronic obstructive pulmonary disease, unspecified: Secondary | ICD-10-CM

## 2022-01-09 DIAGNOSIS — I82409 Acute embolism and thrombosis of unspecified deep veins of unspecified lower extremity: Secondary | ICD-10-CM

## 2022-01-09 DIAGNOSIS — D509 Iron deficiency anemia, unspecified: Secondary | ICD-10-CM

## 2022-01-09 HISTORY — PX: ESOPHAGOGASTRODUODENOSCOPY (EGD) WITH PROPOFOL: SHX5813

## 2022-01-09 LAB — PROTIME-INR
INR: 1.4 — ABNORMAL HIGH (ref 0.8–1.2)
Prothrombin Time: 17.3 seconds — ABNORMAL HIGH (ref 11.4–15.2)

## 2022-01-09 LAB — CBC
HCT: 25.5 % — ABNORMAL LOW (ref 36.0–46.0)
HCT: 28 % — ABNORMAL LOW (ref 36.0–46.0)
Hemoglobin: 8.1 g/dL — ABNORMAL LOW (ref 12.0–15.0)
Hemoglobin: 8.6 g/dL — ABNORMAL LOW (ref 12.0–15.0)
MCH: 28.4 pg (ref 26.0–34.0)
MCH: 28.1 pg (ref 26.0–34.0)
MCHC: 31.8 g/dL (ref 30.0–36.0)
MCHC: 30.7 g/dL (ref 30.0–36.0)
MCV: 89.5 fL (ref 80.0–100.0)
MCV: 91.5 fL (ref 80.0–100.0)
Platelets: 221 10*3/uL (ref 150–400)
Platelets: 263 10*3/uL (ref 150–400)
RBC: 2.85 MIL/uL — ABNORMAL LOW (ref 3.87–5.11)
RBC: 3.06 MIL/uL — ABNORMAL LOW (ref 3.87–5.11)
RDW: 18 % — ABNORMAL HIGH (ref 11.5–15.5)
RDW: 18.2 % — ABNORMAL HIGH (ref 11.5–15.5)
WBC: 9.2 10*3/uL (ref 4.0–10.5)
WBC: 9.4 10*3/uL (ref 4.0–10.5)
nRBC: 0.4 % — ABNORMAL HIGH (ref 0.0–0.2)
nRBC: 0.3 % — ABNORMAL HIGH (ref 0.0–0.2)

## 2022-01-09 SURGERY — ESOPHAGOGASTRODUODENOSCOPY (EGD) WITH PROPOFOL
Anesthesia: Monitor Anesthesia Care

## 2022-01-09 MED ORDER — LIDOCAINE 2% (20 MG/ML) 5 ML SYRINGE
INTRAMUSCULAR | Status: DC | PRN
Start: 2022-01-09 — End: 2022-01-09
  Administered 2022-01-09: 60 mg via INTRAVENOUS

## 2022-01-09 MED ORDER — POLYETHYLENE GLYCOL 3350 17 G PO PACK
17.0000 g | PACK | Freq: Once | ORAL | Status: AC
Start: 2022-01-09 — End: 2022-01-09
  Administered 2022-01-09: 17 g via ORAL
  Filled 2022-01-09: qty 1

## 2022-01-09 MED ORDER — PROPOFOL 500 MG/50ML IV EMUL
INTRAVENOUS | Status: DC | PRN
Start: 2022-01-09 — End: 2022-01-09
  Administered 2022-01-09: 150 ug/kg/min via INTRAVENOUS

## 2022-01-09 MED ORDER — PHENYLEPHRINE 80 MCG/ML (10ML) SYRINGE FOR IV PUSH (FOR BLOOD PRESSURE SUPPORT)
PREFILLED_SYRINGE | INTRAVENOUS | Status: DC | PRN
  Administered 2022-01-09: 80 ug via INTRAVENOUS

## 2022-01-09 MED ORDER — BISACODYL 5 MG PO TBEC
10.0000 mg | DELAYED_RELEASE_TABLET | Freq: Once | ORAL | Status: AC
  Administered 2022-01-09: 10 mg via ORAL
  Filled 2022-01-09: qty 2

## 2022-01-09 SURGICAL SUPPLY — 15 items

## 2022-01-09 NOTE — Anesthesia Postprocedure Evaluation (Signed)
Anesthesia Post Note ? ?Patient: Brandy Edwards ? ?Procedure(s) Performed: ESOPHAGOGASTRODUODENOSCOPY (EGD) WITH PROPOFOL ? ?  ? ?Patient location during evaluation: PACU ?Anesthesia Type: MAC ?Level of consciousness: awake and alert, patient cooperative and oriented ?Pain management: pain level controlled ?Vital Signs Assessment: post-procedure vital signs reviewed and stable ?Respiratory status: spontaneous breathing, nonlabored ventilation, respiratory function stable and patient connected to nasal cannula oxygen ?Cardiovascular status: blood pressure returned to baseline and stable ?Postop Assessment: no apparent nausea or vomiting ?Anesthetic complications: no ? ? ?No notable events documented. ? ?Last Vitals:  ?Vitals:  ? 01/09/22 0945 01/09/22 0955  ?BP: (!) 142/50 (!) 139/55  ?Pulse: (!) 110 (!) 108  ?Resp: 16 14  ?Temp:    ?SpO2: 92% 92%  ?  ?Last Pain:  ?Vitals:  ? 01/09/22 0955  ?TempSrc:   ?PainSc: 0-No pain  ? ? ?  ?  ?  ?  ?  ?  ? ?Brandy Edwards,E. Terez Montee ? ? ? ? ?
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
## 2022-01-09 NOTE — Transfer of Care (Signed)
Immediate Anesthesia Transfer of Care Note ? ?Patient: Brandy Edwards ? ?Procedure(s) Performed: ESOPHAGOGASTRODUODENOSCOPY (EGD) WITH PROPOFOL ? ?Patient Location: PACU ? ?Anesthesia Type:MAC ? ?Level of Consciousness: awake, alert , oriented and patient cooperative ? ?Airway & Oxygen Therapy: Patient Spontanous Breathing and Patient connected to nasal cannula oxygen ? ?Post-op Assessment: Report given to RN and Post -op Vital signs reviewed and stable ? ?Post vital signs: Reviewed and stable ? ?Last Vitals:  ?Vitals Value Taken Time  ?BP    ?Temp    ?Pulse 105 01/09/22 0933  ?Resp 13 01/09/22 0933  ?SpO2 98 % 01/09/22 0933  ?Vitals shown include unvalidated device data. ? ?Last Pain:  ?Vitals:  ? 01/09/22 0852  ?TempSrc: Temporal  ?PainSc: 0-No pain  ?   ? ?Patients Stated Pain Goal: 0 (01/07/22 1530) ? ?Complications: No notable events documented. ?

## 2022-01-09 NOTE — Interval H&P Note (Signed)
History and Physical Interval Note: ? ?01/09/2022 ?9:14 AM ? ?Brandy Edwards  has presented today for surgery, with the diagnosis of anemia, heme positive.  The various methods of treatment have been discussed with the patient and family. After consideration of risks, benefits and other options for treatment, the patient has consented to  Procedure(s): ?ESOPHAGOGASTRODUODENOSCOPY (EGD) WITH PROPOFOL (N/A) as a surgical intervention.  The patient's history has been reviewed, patient examined, no change in status, stable for surgery.  I have reviewed the patient's chart and labs.  Questions were answered to the patient's satisfaction.   ? ?Patient looks well. ?Hgb stable ?Proceed with EGD ?Charlie Pitter III ? ? ?

## 2022-01-09 NOTE — Anesthesia Preprocedure Evaluation (Addendum)
Anesthesia Evaluation  ?Patient identified by MRN, date of birth, ID band ?Patient awake ? ? ? ?Reviewed: ?Allergy & Precautions, NPO status , Patient's Chart, lab work & pertinent test results ? ?History of Anesthesia Complications ?Negative for: history of anesthetic complications ? ?Airway ?Mallampati: I ? ?TM Distance: >3 FB ?Neck ROM: Full ? ? ? Dental ? ?(+) Edentulous Upper, Edentulous Lower ?  ?Pulmonary ?COPD,  COPD inhaler,  ?  ?breath sounds clear to auscultation ? ? ? ? ? ? Cardiovascular ?hypertension, Pt. on medications ?(-) angina+ DVT  ? ?Rhythm:Regular Rate:Normal ? ? ?  ?Neuro/Psych ?Seizures -, Well Controlled,  Depression   ? GI/Hepatic ?Neg liver ROS, GERD  Medicated and Controlled,  ?Endo/Other  ?Morbid obesity ? Renal/GU ?negative Renal ROS  ? ?  ?Musculoskeletal ? ? Abdominal ?(+) + obese,   ?Peds ? Hematology ? ?(+) Blood dyscrasia (Hb 8.1, plt 221k), anemia , xarelto ?INR 1.4   ?Anesthesia Other Findings ? ? Reproductive/Obstetrics ? ?  ? ? ? ? ? ? ? ? ? ? ? ? ? ?  ?  ? ? ? ? ? ? ? ?Anesthesia Physical ?Anesthesia Plan ? ?ASA: 3 ? ?Anesthesia Plan: MAC  ? ?Post-op Pain Management:   ? ?Induction:  ? ?PONV Risk Score and Plan: 2 and Treatment may vary due to age or medical condition ? ?Airway Management Planned: Nasal Cannula and Natural Airway ? ?Additional Equipment: None ? ?Intra-op Plan:  ? ?Post-operative Plan:  ? ?Informed Consent: I have reviewed the patients History and Physical, chart, labs and discussed the procedure including the risks, benefits and alternatives for the proposed anesthesia with the patient or authorized representative who has indicated his/her understanding and acceptance.  ? ? ? ? ? ?Plan Discussed with: CRNA and Surgeon ? ?Anesthesia Plan Comments:   ? ? ? ? ? ?Anesthesia Quick Evaluation ? ?

## 2022-01-09 NOTE — Op Note (Signed)
[facility] ?Patient Name: Brandy Edwards ?Procedure Date : 01/09/2022 ?MRN: [id number] ?Attending MD: Estill Cotta. Loletha Carrow , MD ?Date of Birth: [birth year] ?CSN: [id number] ?Age: 76 ?Admit Type: Inpatient ?Procedure:                Upper GI endoscopy ?Indications:              Iron deficiency anemia ?                          (multifactorial anemia - questions of GI source of  ?                          blood loss as a contributing factor) - also had  ?                          large traumatic chest wall hematoma ?                          reportedly heme positive at nursing facility, heme  ?                          negative here ?Providers:                Estill Cotta. Loletha Carrow, MD, William Dalton, Technician,  ?                          Carlyn Reichert, RN ?Referring MD:             [facility] ?Medicines:                Monitored Anesthesia Care ?Complications:            No immediate complications. ?Estimated Blood Loss:     Estimated blood loss: none. ?Procedure:                Pre-Anesthesia Assessment: ?                          - Prior to the procedure, a History and Physical  ?                          was performed, and patient medications and  ?                          allergies were reviewed. The patient's tolerance of  ?                          previous anesthesia was also reviewed. The risks  ?                          and benefits of the procedure and the sedation  ?                          options and risks were discussed with the patient.  ?                          All questions were  answered, and informed consent  ?                          was obtained. Prior Anticoagulants: The patient has  ?                          taken Eliquis (apixaban), last dose was 2 days  ?                          prior to procedure. ASA Grade Assessment: III - A  ?                          patient with severe systemic disease. After  ?                          reviewing the risks and benefits, the patient was  ?                           deemed in satisfactory condition to undergo the  ?                          procedure. ?                          After obtaining informed consent, the endoscope was  ?                          passed under direct vision. Throughout the  ?                          procedure, the patient's blood pressure, pulse, and  ?                          oxygen saturations were monitored continuously. The  ?                          [id number] Olympus endoscope was introduced  ?                          through the mouth, and advanced to the second part  ?                          of duodenum. The upper GI endoscopy was  ?                          accomplished without difficulty. The patient  ?                          tolerated the procedure well. ?Scope In: ?Scope Out: ?Findings: ?     The larynx was normal. ?     The esophagus was normal. ?     The stomach was normal. ?     The cardia and gastric fundus were normal on retroflexion. ?     The examined duodenum was normal. ?Impression:               -  Normal larynx. ?                          - Normal esophagus. ?                          - Normal stomach. ?                          - Normal examined duodenum. ?                          - No specimens collected. ?Recommendation:           - Return patient to hospital ward for ongoing care. ?                          - Low residue diet today. ?                          - Colonoscopy 01/11/22 - she will receive ducolax  ?                          today and start bowel preparation tomorrow AM. ?Procedure Code(s):        --- Professional --- ?                          [phone], Esophagogastroduodenoscopy, flexible,  ?                          transoral; diagnostic, including collection of  ?                          specimen(s) by brushing or washing, when performed  ?                          (separate procedure) ?Diagnosis Code(s):        --- Professional --- ?                          D50.9, Iron  deficiency anemia, unspecified ?CPT copyright 2019 [employer] Medical [employer]. All rights reserved. ?The codes documented in this report are preliminary and upon coder review may  ?be revised to meet current compliance requirements. ?Marisha Renier L. Loletha Carrow, MD ?01/09/2022 9:35:25 AM ?This report has been signed electronically. ?Number of Addenda: 0 ?

## 2022-01-10 DIAGNOSIS — D649 Anemia, unspecified: Secondary | ICD-10-CM | POA: Diagnosis not present

## 2022-01-10 LAB — CBC
HCT: 26.7 % — ABNORMAL LOW (ref 36.0–46.0)
Hemoglobin: 8.3 g/dL — ABNORMAL LOW (ref 12.0–15.0)
MCH: 28.3 pg (ref 26.0–34.0)
MCHC: 31.1 g/dL (ref 30.0–36.0)
MCV: 91.1 fL (ref 80.0–100.0)
Platelets: 257 10*3/uL (ref 150–400)
RBC: 2.93 MIL/uL — ABNORMAL LOW (ref 3.87–5.11)
RDW: 18.6 % — ABNORMAL HIGH (ref 11.5–15.5)
WBC: 7.9 10*3/uL (ref 4.0–10.5)
nRBC: 0.4 % — ABNORMAL HIGH (ref 0.0–0.2)

## 2022-01-10 LAB — GLUCOSE, CAPILLARY: Glucose-Capillary: 74 mg/dL (ref 70–99)

## 2022-01-10 MED ORDER — PEG-KCL-NACL-NASULF-NA ASC-C 100 G PO SOLR
1.0000 | Freq: Once | ORAL | Status: DC
Start: 2022-01-10 — End: 2022-01-10

## 2022-01-10 MED ORDER — PEG-KCL-NACL-NASULF-NA ASC-C 100 G PO SOLR
1.0000 | Freq: Once | ORAL | Status: DC
Start: 2022-01-11 — End: 2022-01-10

## 2022-01-10 MED ORDER — PEG-KCL-NACL-NASULF-NA ASC-C 100 G PO SOLR
0.5000 | Freq: Once | ORAL | Status: AC
Start: 2022-01-11 — End: 2022-01-11
  Administered 2022-01-11: 100 g via ORAL
  Filled 2022-01-10: qty 1

## 2022-01-10 MED ORDER — PEG-KCL-NACL-NASULF-NA ASC-C 100 G PO SOLR
0.5000 | Freq: Once | ORAL | Status: AC
Start: 2022-01-10 — End: 2022-01-10
  Administered 2022-01-10: 100 g via ORAL
  Filled 2022-01-10: qty 1

## 2022-01-10 NOTE — Progress Notes (Signed)
Patient ID: Brandy Edwards, female   DOB: [birth year], 76 y.o.   MRN: [id number] ? ? ?Brief GI NOTE ? ?Patient is being followed for iron deficiency anemia, heme positive stool, profound anemia on admit felt multifactorial with large traumatic chest wall hematoma ? ?EGD yesterday normal ? ? ?Hemoglobin stable since transfusions-8.3 today ? ?Xarelto on hold since admission ? ? ?Patient is undergoing bowel prep today and is scheduled for colonoscopy tomorrow with Dr. Barron Alvine. ? ?GI recommendations pending results at colonoscopy. ? ? ? ?

## 2022-01-10 NOTE — H&P (View-Only) (Signed)
Patient ID: Brandy Edwards, female   DOB: 04/06/1946, 75 y.o.   MRN: [id number] ? ? ?Brief GI NOTE ? ?Patient is being followed for iron deficiency anemia, heme positive stool, profound anemia on admit felt multifactorial with large traumatic chest wall hematoma ? ?EGD yesterday normal ? ? ?Hemoglobin stable since transfusions-8.3 today ? ?Xarelto on hold since admission ? ? ?Patient is undergoing bowel prep today and is scheduled for colonoscopy tomorrow with Dr. Cirigliano. ? ?GI recommendations pending results at colonoscopy. ? ? ? ?
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
## 2022-01-10 NOTE — Plan of Care (Signed)
  Problem: Education: Goal: Knowledge of General Education information will improve Description Including pain rating scale, medication(s)/side effects and non-pharmacologic comfort measures Outcome: Progressing   

## 2022-01-11 ENCOUNTER — Encounter (HOSPITAL_COMMUNITY)
Admission: EM | Disposition: A | Discharge status: Skilled Nursing Facility | Payer: Self-pay | Source: Skilled Nursing Facility | Attending: Family Medicine

## 2022-01-11 ENCOUNTER — Encounter (HOSPITAL_COMMUNITY): Payer: Self-pay | Admitting: Family Medicine

## 2022-01-11 ENCOUNTER — Inpatient Hospital Stay (HOSPITAL_COMMUNITY): Payer: Medicare Other | Admitting: Anesthesiology

## 2022-01-11 DIAGNOSIS — K5730 Diverticulosis of large intestine without perforation or abscess without bleeding: Secondary | ICD-10-CM

## 2022-01-11 DIAGNOSIS — D509 Iron deficiency anemia, unspecified: Secondary | ICD-10-CM

## 2022-01-11 DIAGNOSIS — D649 Anemia, unspecified: Secondary | ICD-10-CM | POA: Diagnosis not present

## 2022-01-11 DIAGNOSIS — I10 Essential (primary) hypertension: Secondary | ICD-10-CM

## 2022-01-11 DIAGNOSIS — K6389 Other specified diseases of intestine: Secondary | ICD-10-CM

## 2022-01-11 HISTORY — PX: COLONOSCOPY WITH PROPOFOL: SHX5780

## 2022-01-11 SURGERY — COLONOSCOPY WITH PROPOFOL
Anesthesia: Monitor Anesthesia Care

## 2022-01-11 MED ORDER — SODIUM CHLORIDE 0.9 % IV SOLN: INTRAVENOUS | Status: DC

## 2022-01-11 MED ORDER — RIVAROXABAN 20 MG PO TABS
20.0000 mg | ORAL_TABLET | Freq: Every day | ORAL | Status: DC
  Administered 2022-01-11 – 2022-01-12 (×2): 20 mg via ORAL
  Filled 2022-01-11 (×2): qty 1

## 2022-01-11 MED ORDER — PROPOFOL 500 MG/50ML IV EMUL
INTRAVENOUS | Status: DC | PRN
  Administered 2022-01-11: 100 ug/kg/min via INTRAVENOUS

## 2022-01-11 MED ORDER — PROPOFOL 10 MG/ML IV BOLUS
INTRAVENOUS | Status: DC | PRN
  Administered 2022-01-11 (×2): 10 mg via INTRAVENOUS

## 2022-01-11 SURGICAL SUPPLY — 22 items
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
## 2022-01-11 NOTE — Anesthesia Procedure Notes (Signed)
Procedure Name: [place] ?Date/Time: 01/11/2022 11:18 AM ?Performed by: Lieutenant Diego, CRNA ?Pre-anesthesia Checklist: Patient identified, Emergency Drugs available, Suction available, Patient being monitored and Timeout performed ?Patient Re-evaluated:Patient Re-evaluated prior to induction ?Oxygen Delivery Method: Nasal cannula ?Preoxygenation: Pre-oxygenation with 100% oxygen ?Induction Type: IV induction ? ? ? ? ?

## 2022-01-11 NOTE — Transfer of Care (Signed)
Immediate Anesthesia Transfer of Care Note ? ?Patient: Brandy Edwards ? ?Procedure(s) Performed: COLONOSCOPY WITH PROPOFOL ? ?Patient Location: Endoscopy Unit ? ?Anesthesia Type:MAC ? ?Level of Consciousness: awake and alert  ? ?Airway & Oxygen Therapy: Patient Spontanous Breathing and Patient connected to nasal cannula oxygen ? ?Post-op Assessment: Report given to RN and Post -op Vital signs reviewed and stable ? ?Post vital signs: Reviewed and stable ? ?Last Vitals:  ?Vitals Value Taken Time  ?BP 128/45 01/11/22 1146  ?Temp    ?Pulse 106 01/11/22 1147  ?Resp 18 01/11/22 1147  ?SpO2 97 % 01/11/22 1147  ?Vitals shown include unvalidated device data. ? ?Last Pain:  ?Vitals:  ? 01/11/22 1030  ?TempSrc: Temporal  ?PainSc: 0-No pain  ?   ? ?Patients Stated Pain Goal: 0 (01/07/22 1530) ? ?Complications: No notable events documented. ?

## 2022-01-11 NOTE — TOC Progression Note (Signed)
Transition of Care (TOC) - Initial/Assessment Note  ? ? ?Patient Details  ?Name: Brandy Edwards ?MRN: [id number] ?Date of Birth: Aug 13, 1946 ? ?Transition of Care (TOC) CM/SW Contact:    ?Paulene Floor Jream Broyles, LCSWA ?Phone Number: ?01/11/2022, 10:18 AM ? ?Clinical Narrative:                 ?CSW contacted Sharyn Lull in admissions at [facility] and informed her that, per MD, patient should be medically ready to d/c tomorrow. ? ?TOC will continue to follow.  ? ?  ?  ? ? ?Patient Goals and CMS Choice ?  ?  ?  ? ?Expected Discharge Plan and Services ?  ?  ?  ?  ?  ?                ?  ?  ?  ?  ?  ?  ?  ?  ?  ?  ? ?Prior Living Arrangements/Services ?  ?  ?  ?       ?  ?  ?  ?  ? ?Activities of Daily Living ?Home Assistive Devices/Equipment: Wheelchair, [employer] fast streamer ?ADL Screening (condition at time of admission) ?Patient's cognitive ability adequate to safely complete daily activities?: Yes ?Is the patient deaf or have difficulty hearing?: No ?Does the patient have difficulty seeing, even when wearing glasses/contacts?: No ?Does the patient have difficulty concentrating, remembering, or making decisions?: No ?Patient able to express need for assistance with ADLs?: Yes ?Does the patient have difficulty dressing or bathing?: Yes ?Independently performs ADLs?: No ?Communication: Independent ?Dressing (OT): Dependent ?Is this a change from baseline?: Pre-admission baseline ?Grooming: Dependent ?Is this a change from baseline?: Pre-admission baseline ?Feeding: Dependent ?Is this a change from baseline?: Pre-admission baseline ?Bathing: Dependent ?Is this a change from baseline?: Pre-admission baseline ?Toileting: Dependent ?Is this a change from baseline?: Pre-admission baseline ?In/Out Bed: Dependent ?Is this a change from baseline?: Pre-admission baseline ?Does the patient have difficulty walking or climbing stairs?: Yes ?Weakness of Legs: Both ?Weakness of Arms/Hands: Both ? ?Permission Sought/Granted ?  ?  ?   ?   ?   ?    ? ?Emotional Assessment ?  ?  ?  ?  ?  ?  ? ?Admission diagnosis:  Symptomatic anemia [D64.9] ?Anemia, unspecified type [D64.9] ?Patient Active Problem List  ? Diagnosis Date Noted  ? Symptomatic anemia 01/07/2022  ? Occult GI bleeding 01/07/2022  ? Quadriplegia ([place]) 01/07/2022  ? History of DVT (deep vein thrombosis) 01/07/2022  ? Chronic venous insufficiency 10/26/2021  ? Chronic pain disorder 05/16/2019  ? Lymphedema 04/11/2018  ? Bilateral lower extremity edema 03/08/2018  ? Left leg pain 03/08/2018  ? Impacted cerumen of both ears 12/14/2017  ? Sensorineural hearing loss (SNHL), bilateral 12/14/2017  ? Symptomatic urinary tract infection   ? Acute bronchitis   ? Acute encephalopathy 09/29/2017  ? Dysuria 09/29/2017  ? Non-productive cough 09/29/2017  ? Falls 09/20/2017  ? Muscle weakness 09/20/2017  ? Hypertensive disorder 09/20/2017  ? Chronic allergic rhinitis 11/30/2016  ? Muscle spasticity 11/30/2016  ? Gastroesophageal reflux disease without esophagitis 11/30/2016  ? Chronic constipation 11/30/2016  ? Hyperlipidemia 11/30/2016  ? Cervical myelopathy ([place]) 11/30/2016  ? Essential hypertension 09/23/2016  ? Depression 09/23/2016  ? Neuropathy 09/23/2016  ? Insomnia 09/23/2016  ? Acute deep vein thrombosis (DVT) of distal end of left lower extremity ([place]) 06/14/2016  ? Blood loss anemia 06/14/2016  ? Bilateral corneal abrasions 06/03/2016  ? Chest pain 06/03/2016  ? Contusion  of abdominal wall 06/03/2016  ? Fusion of spine 06/03/2016  ? Hematoma of frontal scalp 06/03/2016  ? Hypokalemia 06/03/2016  ? Obesity 06/03/2016  ? Palpitations 06/03/2016  ? Prediabetes 06/03/2016  ? Status post cervical arthrodesis 06/03/2016  ? ?PCP:  Pcp, No ?Pharmacy:   ?[place], [place] ?[address] ?[place] [address] ?Phone: [phone] Fax: [phone] ? ? ? ? ?Social Determinants of Health (SDOH) Interventions ?  ? ?Readmission Risk Interventions ?   ? View : No data to display.  ?  ?   ?  ? ? ? ?

## 2022-01-11 NOTE — Anesthesia Preprocedure Evaluation (Signed)
Anesthesia Evaluation  ?Patient identified by MRN, date of birth, ID band ?Patient awake ? ? ? ?Reviewed: ?Allergy & Precautions, NPO status , Patient's Chart, lab work & pertinent test results ? ?History of Anesthesia Complications ?Negative for: history of anesthetic complications ? ?Airway ?Mallampati: I ? ?TM Distance: >3 FB ?Neck ROM: Full ? ? ? Dental ? ?(+) Edentulous Upper, Edentulous Lower ?  ?Pulmonary ?COPD,  COPD inhaler,  ?  ?breath sounds clear to auscultation ? ? ? ? ? ? Cardiovascular ?hypertension, Pt. on medications ?(-) angina+ DVT  ? ?Rhythm:Regular Rate:Normal ? ? ?  ?Neuro/Psych ?Seizures -, Well Controlled,  Depression   ? GI/Hepatic ?Neg liver ROS, GERD  Medicated and Controlled,  ?Endo/Other  ?Morbid obesity ? Renal/GU ?negative Renal ROS  ? ?  ?Musculoskeletal ? ? Abdominal ?(+) + obese,   ?Peds ? Hematology ? ?(+) Blood dyscrasia (Hb 8.1, plt 221k), anemia , xarelto ?INR 1.4   ?Anesthesia Other Findings ? ? Reproductive/Obstetrics ? ?  ? ? ? ? ? ? ? ? ? ? ? ? ? ?  ?  ? ? ? ? ? ? ? ? ?Anesthesia Physical ? ?Anesthesia Plan ? ?ASA: 3 ? ?Anesthesia Plan: MAC  ? ?Post-op Pain Management: Minimal or no pain anticipated  ? ?Induction: Intravenous ? ?PONV Risk Score and Plan: 2 and Treatment may vary due to age or medical condition, Propofol infusion and Ondansetron ? ?Airway Management Planned: Natural Airway and Simple Face Mask ? ?Additional Equipment: None ? ?Intra-op Plan:  ? ?Post-operative Plan:  ? ?Informed Consent: I have reviewed the patients History and Physical, chart, labs and discussed the procedure including the risks, benefits and alternatives for the proposed anesthesia with the patient or authorized representative who has indicated his/her understanding and acceptance.  ? ? ? ? ? ?Plan Discussed with: CRNA and Surgeon ? ?Anesthesia Plan Comments:   ? ? ? ? ? ? ?Anesthesia Quick Evaluation ? ?

## 2022-01-11 NOTE — Anesthesia Postprocedure Evaluation (Signed)
Anesthesia Post Note ? ?Patient: Brandy Edwards ? ?Procedure(s) Performed: COLONOSCOPY WITH PROPOFOL ? ?  ? ?Patient location during evaluation: PACU ?Anesthesia Type: MAC ?Level of consciousness: awake and alert ?Pain management: pain level controlled ?Vital Signs Assessment: post-procedure vital signs reviewed and stable ?Respiratory status: spontaneous breathing, nonlabored ventilation and respiratory function stable ?Cardiovascular status: blood pressure returned to baseline and stable ?Postop Assessment: no apparent nausea or vomiting ?Anesthetic complications: no ? ? ?No notable events documented. ? ?Last Vitals:  ?Vitals:  ? 01/11/22 1210 01/11/22 1215  ?BP: (!) 148/53 (!) 137/59  ?Pulse: (!) 106   ?Resp: 19   ?Temp:    ?SpO2: 95%   ?  ?Last Pain:  ?Vitals:  ? 01/11/22 1205  ?TempSrc:   ?PainSc: 0-No pain  ? ? ?  ?  ?  ?  ?  ?  ? ?Lynda Rainwater ? ? ? ? ?

## 2022-01-11 NOTE — Care Management Important Message (Signed)
Important Message ? ?Patient Details  ?Name: Brandy Edwards ?MRN: [id number] ?Date of Birth: [birth year] ? ? ?Medicare Important Message Given:  Yes ? ? ? ? ?Mardene Sayer ?01/11/2022, 2:20 PM ?

## 2022-01-11 NOTE — Op Note (Signed)
[facility] ?Patient Name: Brandy Edwards ?Procedure Date : 01/11/2022 ?MRN: [id number] ?Attending MD: Gerrit Heck , MD ?Date of Birth: 1946-05-10 ?CSN: [id number] ?Age: 76 ?Admit Type: Outpatient ?Procedure:                Colonoscopy ?Indications:              Heme positive stool, Iron deficiency anemia ?                          EGD on 5/13 was normal. Hgb/Hct stable over last 24  ?                          hours and no overt bleeding. ?Providers:                Gerrit Heck, MD, Dulcy Fanny, Charlean Merl  ?                          Purcell Nails, Technician ?Referring MD:              ?Medicines:                Monitored Anesthesia Care ?Complications:            No immediate complications. ?Estimated Blood Loss:     Estimated blood loss: none. ?Procedure:                Pre-Anesthesia Assessment: ?                          - Prior to the procedure, a History and Physical  ?                          was performed, and patient medications and  ?                          allergies were reviewed. The patient's tolerance of  ?                          previous anesthesia was also reviewed. The risks  ?                          and benefits of the procedure and the sedation  ?                          options and risks were discussed with the patient.  ?                          All questions were answered, and informed consent  ?                          was obtained. Prior Anticoagulants: The patient has  ?                          taken Xarelto (rivaroxaban), last dose was 4 days  ?  prior to procedure. ASA Grade Assessment: III - A  ?                          patient with severe systemic disease. After  ?                          reviewing the risks and benefits, the patient was  ?                          deemed in satisfactory condition to undergo the  ?                          procedure. ?                          After obtaining informed consent, the colonoscope  ?                           was passed under direct vision. Throughout the  ?                          procedure, the patient's blood pressure, pulse, and  ?                          oxygen saturations were monitored continuously. The  ?                          PCF-[id number]) Olympus peds colonoscope was  ?                          introduced through the anus and advanced to the the  ?                          cecum, identified by appendiceal orifice and  ?                          ileocecal valve. The colonoscopy was performed  ?                          without difficulty. The patient tolerated the  ?                          procedure well. The quality of the bowel  ?                          preparation was adequate. The ileocecal valve,  ?                          appendiceal orifice, and rectum were photographed. ?Scope In: 11:17:38 AM ?Scope Out: 11:31:55 AM ?Scope Withdrawal Time: 0 hours 8 minutes 9 seconds  ?Total Procedure Duration: 0 hours 14 minutes 17 seconds  ?Findings: ?     The perianal and digital rectal examinations were normal. ?     A moderate amount of stool was found in the rectum and in  the sigmoid  ?     colon. Lavage of the area was performed using copious amounts of tap  ?     water, resulting in clearance with fair visualization. ?     A diffuse area of mild melanosis was found in the entire colon. ?     A few small-mouthed diverticula were found in the sigmoid colon and  ?     descending colon. ?     The exam was otherwise normal throughout the examined colon. No active  ?     bleeding or stigmata of bleeding noted on this study. Could not fully  ?     intubate the terminal ileum due to looping in the right colon and body  ?     habitus, but prolonged views of the IC valve demonstrated no bleeding  ?     eminating from the proximal GI tract. ?Impression:               - Stool in the rectum and in the sigmoid colon.  ?                          This was lavaged with adequate  visualization. ?                          - Benign melanosis coli. ?                          - Diverticulosis in the sigmoid colon and in the  ?                          descending colon. ?                          - No active bleeding or stigmata of bleeding noted  ?                          on this study. ?Recommendation:           - Return patient to hospital ward for ongoing care. ?                          - Advance diet as tolerated. ?                          - Continue present medications. ?                          - Repeat colonoscopy PRN. ?                          - Suspect multifactorial etiology for IDA, to  ?                          include exacerbation related to chest wall hematoma  ?                          and supratherapeutic INR. ?                          -  Can repeat CBC with PCM in 7-10 days after  ?                          discharge to ensure returning to baseline, with  ?                          repeat iron studies in 2 months. ?                          - Inpatient GI service will sign off at this time.  ?                          Please do not hesitate to contact with additional  ?                          questions or concerns. ?                          - Return to GI office PRN. ?Procedure Code(s):        --- Professional --- ?                          [phone], Colonoscopy, flexible; diagnostic, including  ?                          collection of specimen(s) by brushing or washing,  ?                          when performed (separate procedure) ?Diagnosis Code(s):        --- Professional --- ?                          K63.89, Other specified diseases of intestine ?                          R19.5, Other fecal abnormalities ?                          D50.9, Iron deficiency anemia, unspecified ?                          K57.30, Diverticulosis of large intestine without  ?                          perforation or abscess without bleeding ?CPT copyright 2019 American Medical Association.  All rights reserved. ?The codes documented in this report are preliminary and upon coder review may  ?be revised to meet current compliance requirements. ?Gerrit Heck, MD ?01/11/2022 11:48:44 AM ?Number of Addenda: 0 ?

## 2022-01-11 NOTE — Interval H&P Note (Signed)
History and Physical Interval Note: ? ?01/11/2022 ?10:37 AM ? ?Brandy Edwards  has presented today for surgery, with the diagnosis of iron deficiency anemia.  The various methods of treatment have been discussed with the patient and family. After consideration of risks, benefits and other options for treatment, the patient has consented to  Procedure(s): ?COLONOSCOPY WITH PROPOFOL (N/A) as a surgical intervention.  The patient's history has been reviewed, patient examined, no change in status, stable for surgery.  I have reviewed the patient's chart and labs.  Questions were answered to the patient's satisfaction.   ? ? ?Uthman Mroczkowski V Barbarita Hutmacher ? ? ?

## 2022-01-12 ENCOUNTER — Encounter (HOSPITAL_COMMUNITY): Payer: Self-pay | Admitting: Gastroenterology

## 2022-01-12 LAB — CBC WITH DIFFERENTIAL/PLATELET
Abs Immature Granulocytes: 0.06 10*3/uL (ref 0.00–0.07)
Basophils Absolute: 0 10*3/uL (ref 0.0–0.1)
Basophils Relative: 0 %
Eosinophils Absolute: 0.4 10*3/uL (ref 0.0–0.5)
Eosinophils Relative: 5 %
HCT: 30.2 % — ABNORMAL LOW (ref 36.0–46.0)
Hemoglobin: 9.4 g/dL — ABNORMAL LOW (ref 12.0–15.0)
Immature Granulocytes: 1 %
Lymphocytes Relative: 22 %
Lymphs Abs: 1.7 10*3/uL (ref 0.7–4.0)
MCH: 28.6 pg (ref 26.0–34.0)
MCHC: 31.1 g/dL (ref 30.0–36.0)
MCV: 91.8 fL (ref 80.0–100.0)
Monocytes Absolute: 0.4 10*3/uL (ref 0.1–1.0)
Monocytes Relative: 5 %
Neutro Abs: 5.4 10*3/uL (ref 1.7–7.7)
Neutrophils Relative %: 67 %
Platelets: 321 10*3/uL (ref 150–400)
RBC: 3.29 MIL/uL — ABNORMAL LOW (ref 3.87–5.11)
RDW: 19.2 % — ABNORMAL HIGH (ref 11.5–15.5)
WBC: 7.9 10*3/uL (ref 4.0–10.5)
nRBC: 0.6 % — ABNORMAL HIGH (ref 0.0–0.2)

## 2022-01-12 LAB — BASIC METABOLIC PANEL
Anion gap: 9 (ref 5–15)
BUN: <5 mg/dL — ABNORMAL LOW (ref 8–23)
CO2: 28 mmol/L (ref 22–32)
Calcium: 9 mg/dL (ref 8.9–10.3)
Chloride: 104 mmol/L (ref 98–111)
Creatinine, Ser: 0.68 mg/dL (ref 0.44–1.00)
GFR, Estimated: >60 mL/min (ref >60)
Glucose, Bld: 100 mg/dL — ABNORMAL HIGH (ref 70–99)
Potassium: 3 mmol/L — ABNORMAL LOW (ref 3.5–5.1)
Sodium: 141 mmol/L (ref 135–145)

## 2022-01-12 LAB — MAGNESIUM: Magnesium: 2 mg/dL (ref 1.7–2.4)

## 2022-01-12 MED ORDER — TRAMADOL-ACETAMINOPHEN 37.5-325 MG PO TABS: 2.0000 | ORAL_TABLET | Freq: Three times a day (TID) | ORAL | 0 refills | Status: DC

## 2022-01-12 MED ORDER — POTASSIUM CHLORIDE CRYS ER 20 MEQ PO TBCR
40.0000 meq | EXTENDED_RELEASE_TABLET | ORAL | Status: DC
  Administered 2022-01-12: 40 meq via ORAL
  Filled 2022-01-12: qty 2

## 2022-01-12 MED ORDER — BACLOFEN 20 MG PO TABS: 20.0000 mg | ORAL_TABLET | Freq: Four times a day (QID) | ORAL | 0 refills | Status: DC

## 2022-01-12 NOTE — Progress Notes (Signed)
Report called to [facility] facility, report given to [facility]. ?

## 2022-01-12 NOTE — TOC Transition Note (Signed)
Transition of Care (TOC) - CM/SW Discharge Note ? ? ?Patient Details  ?Name: Brandy Edwards ?MRN: [id number] ?Date of Birth: June 22, 1946 ? ?Transition of Care (TOC) CM/SW Contact:  ?Catalina Pizza Anyra Kaufman, LCSWA ?Phone Number: ?01/12/2022, 10:16 AM ? ? ?Clinical Narrative:    ?Patient will DC to:  [facility] ?Anticipated DC date:  01/12/2022 ?Family notified: Yes ?Transport by: Sharin Mons ? ? ?Per MD patient ready for DC to SNF-LT. RN to call report prior to discharge [phone] room 203A. RN, patient, patient's family, and facility notified of DC. Discharge Summary and FL2 sent to facility. DC packet on chart. Ambulance transport will be requested for patient.  ? ?CSW will sign off for now as social work intervention is no longer needed. Please consult [place] again if new needs arise. ?  ? ? ?Final next level of care: Skilled Nursing Facility ?Barriers to Discharge: No Barriers Identified ? ? ?Patient Goals and CMS Choice ?  ?  ?  ? ?Discharge Placement ?  ?           ?Patient chooses bed at:  [facility]) ?Patient to be transferred to facility by: [facility] ?Name of family member notified: Ignacia Palma (Daughter)   [phone] ?Patient and family notified of of transfer: 01/12/22 ? ?Discharge Plan and Services ?  ?  ?           ?  ?  ?  ?  ?  ?  ?  ?  ?  ?  ? ?Social Determinants of Health (SDOH) Interventions ?  ? ? ?Readmission Risk Interventions ?   ? View : No data to display.  ?  ?  ?  ? ? ? ? ? ?

## 2022-01-12 NOTE — Progress Notes (Signed)
Patient discharged to [facility] via [facility] [place]. ?
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
## 2022-01-12 NOTE — Progress Notes (Signed)
Attempted calling report to [facility], transferred twice and no answer.  Will attempt at a later time. ?

## 2022-01-19 ENCOUNTER — Other Ambulatory Visit: Payer: Self-pay

## 2022-01-19 DIAGNOSIS — D565 Hemoglobin E-beta thalassemia: Secondary | ICD-10-CM

## 2022-01-26 ENCOUNTER — Telehealth: Payer: Self-pay | Admitting: Hematology and Oncology

## 2022-01-26 NOTE — Telephone Encounter (Signed)
Scheduled appt per 5/30 referral. Spoke to representative at [facility] who is aware of appt date and time. She is aware to have pt arrive 30 mins early for her appt.

## 2022-02-09 ENCOUNTER — Other Ambulatory Visit: Payer: Self-pay

## 2022-02-09 ENCOUNTER — Inpatient Hospital Stay: Payer: Medicare Other | Attending: Hematology and Oncology | Admitting: Hematology and Oncology

## 2022-02-09 ENCOUNTER — Encounter: Payer: Self-pay | Admitting: Hematology and Oncology

## 2022-02-09 VITALS — BP 131/68 | HR 74 | Temp 98.1°F | Resp 16

## 2022-02-09 DIAGNOSIS — Z86718 Personal history of other venous thrombosis and embolism: Secondary | ICD-10-CM | POA: Diagnosis not present

## 2022-02-09 DIAGNOSIS — G8250 Quadriplegia, unspecified: Secondary | ICD-10-CM | POA: Diagnosis not present

## 2022-02-09 DIAGNOSIS — D500 Iron deficiency anemia secondary to blood loss (chronic): Secondary | ICD-10-CM

## 2022-02-09 DIAGNOSIS — Z7901 Long term (current) use of anticoagulants: Secondary | ICD-10-CM | POA: Diagnosis not present

## 2022-02-09 DIAGNOSIS — D509 Iron deficiency anemia, unspecified: Secondary | ICD-10-CM | POA: Diagnosis present
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
## 2022-02-09 NOTE — Assessment & Plan Note (Signed)
She is chronically immobile because of this She will continue best care at her skilled facility

## 2022-02-09 NOTE — Assessment & Plan Note (Signed)
She has well-documented iron deficiency anemia likely due to GI blood loss Even though her EGD and colonoscopy did not reveal source of bleeding, I suspect this is the source of bleeding. Due to poor venous access, I recommend a trial of oral iron supplement daily at bedtime If she is not able to improve her iron store with oral iron, I will arrange for intravenous iron infusion next month

## 2022-02-09 NOTE — Assessment & Plan Note (Signed)
The patient was placed on Xarelto due to remote history of DVT Due to her chronic immobility from her quadriplegia status, I am concerned about recurrent DVT For now, I recommend she continues taking Xarelto at 20 mg daily, indefinitely However, if the patient has severe recurrent iron deficiency anemia, we could potentially reduce the dose to 10 mg daily I plan to reassess next month for further follow-up

## 2022-03-09 ENCOUNTER — Inpatient Hospital Stay: Payer: Medicare Other

## 2022-03-09 ENCOUNTER — Inpatient Hospital Stay (HOSPITAL_BASED_OUTPATIENT_CLINIC_OR_DEPARTMENT_OTHER): Payer: Medicare Other | Admitting: Hematology and Oncology

## 2022-03-09 ENCOUNTER — Other Ambulatory Visit: Payer: Self-pay

## 2022-03-09 ENCOUNTER — Inpatient Hospital Stay: Payer: Medicare Other | Attending: Hematology and Oncology

## 2022-03-09 ENCOUNTER — Encounter: Payer: Self-pay | Admitting: Hematology and Oncology

## 2022-03-09 DIAGNOSIS — Z7901 Long term (current) use of anticoagulants: Secondary | ICD-10-CM | POA: Insufficient documentation

## 2022-03-09 DIAGNOSIS — D509 Iron deficiency anemia, unspecified: Secondary | ICD-10-CM | POA: Insufficient documentation

## 2022-03-09 DIAGNOSIS — G8250 Quadriplegia, unspecified: Secondary | ICD-10-CM | POA: Insufficient documentation

## 2022-03-09 DIAGNOSIS — K5909 Other constipation: Secondary | ICD-10-CM | POA: Diagnosis not present

## 2022-03-09 DIAGNOSIS — Z86718 Personal history of other venous thrombosis and embolism: Secondary | ICD-10-CM | POA: Insufficient documentation

## 2022-03-09 DIAGNOSIS — D500 Iron deficiency anemia secondary to blood loss (chronic): Secondary | ICD-10-CM | POA: Diagnosis not present

## 2022-03-09 LAB — CBC WITH DIFFERENTIAL (CANCER CENTER ONLY)
Abs Immature Granulocytes: 0.01 10*3/uL (ref 0.00–0.07)
Basophils Absolute: 0 10*3/uL (ref 0.0–0.1)
Basophils Relative: 1 %
Eosinophils Absolute: 0.2 10*3/uL (ref 0.0–0.5)
Eosinophils Relative: 4 %
HCT: 41.3 % (ref 36.0–46.0)
Hemoglobin: 13.4 g/dL (ref 12.0–15.0)
Immature Granulocytes: 0 %
Lymphocytes Relative: 30 %
Lymphs Abs: 1.8 10*3/uL (ref 0.7–4.0)
MCH: 30 pg (ref 26.0–34.0)
MCHC: 32.4 g/dL (ref 30.0–36.0)
MCV: 92.4 fL (ref 80.0–100.0)
Monocytes Absolute: 0.3 10*3/uL (ref 0.1–1.0)
Monocytes Relative: 5 %
Neutro Abs: 3.5 10*3/uL (ref 1.7–7.7)
Neutrophils Relative %: 60 %
Platelet Count: 174 10*3/uL (ref 150–400)
RBC: 4.47 MIL/uL (ref 3.87–5.11)
RDW: 16.2 % — ABNORMAL HIGH (ref 11.5–15.5)
WBC Count: 5.8 10*3/uL (ref 4.0–10.5)
nRBC: 0 % (ref 0.0–0.2)

## 2022-03-09 LAB — IRON AND IRON BINDING CAPACITY (CC-WL,HP ONLY)
Iron: 74 ug/dL (ref 28–170)
Saturation Ratios: 23 % (ref 10.4–31.8)
TIBC: 328 ug/dL (ref 250–450)
UIBC: 254 ug/dL (ref 148–442)

## 2022-03-09 LAB — RETICULOCYTES
Immature Retic Fract: 14.5 % (ref 2.3–15.9)
RBC.: 4.52 MIL/uL (ref 3.87–5.11)
Retic Count, Absolute: 81.8 10*3/uL (ref 19.0–186.0)
Retic Ct Pct: 1.8 % (ref 0.4–3.1)

## 2022-03-09 LAB — SAMPLE TO BLOOD BANK

## 2022-03-09 LAB — FERRITIN: Ferritin: 35 ng/mL (ref 11–307)

## 2022-03-09 NOTE — Assessment & Plan Note (Signed)
The patient was placed on Xarelto due to remote history of DVT Due to her chronic immobility from her quadriplegia status, I am concerned about recurrent DVT For now, I recommend she continues taking Xarelto at 20 mg daily, indefinitely However, if the patient has severe recurrent iron deficiency anemia, we could potentially reduce the dose to 10 mg daily She does not need long-term follow-up
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
## 2022-03-09 NOTE — Assessment & Plan Note (Signed)
Her anemia has resolved She appears to be tolerating oral iron supplement well She does not need IV iron or blood transfusion support I have given the patient written instruction to take back to her skilled facility for her primary care doctor there to monitor her blood count every 3 months They will notify me if she needs IV iron or he if hemoglobin is less than 10 I will cancel her infusion appointment today

## 2022-06-04 ENCOUNTER — Other Ambulatory Visit: Payer: Self-pay

## 2022-06-07 ENCOUNTER — Ambulatory Visit: Payer: Medicare Other | Admitting: Gastroenterology

## 2022-10-24 NOTE — Progress Notes (Signed)
MRN : [id number]  Brandy Edwards is a 77 y.o. ([birth year]) female who presents with chief complaint of legs swell.  History of Present Illness:   The patient returns to the office for followup evaluation regarding leg swelling.  The swelling has persisted and she feels have gotten worse.  Originally they were using the lymph pump twice a day but they now only been doing it once a day.  Also they have only been using it on the left leg.  She states that she only has 1 sleeve.  They have also started Lasix but this is not significantly impacting her lymphedema. There have not been any interval development of a ulcerations or wounds.   Since the previous visit the patient has been wearing graduated compression stockings and trying to use the lymph pump on a routine basis.  However, she notes that she never received 2 sleeves and so can only do 1 leg at a time and also the sleeve that she has because of its extensive use is not working very well.   No outpatient medications have been marked as taking for the 10/25/22 encounter (Appointment) with Delana Meyer, Dolores Lory, MD.    Past Medical History:  Diagnosis Date   Acute deep vein thrombosis (DVT) of distal end of left lower extremity (HCC)    Bilateral corneal abrasions    Chronic bronchitis ([place])    Chronic pain disorder 05/16/2019   Depressed    Dysphagia    Fusion of spine, cervical region    Gout    Hypertension    Injury to ligament of cervical spine 05/2016   Neuropathy     Past Surgical History:  Procedure Laterality Date   CESAREAN SECTION     CHOLECYSTECTOMY     COLONOSCOPY WITH PROPOFOL N/A 01/11/2022   Procedure: COLONOSCOPY WITH PROPOFOL;  Surgeon: Lavena Bullion, DO;  Location: [place];  Service: Gastroenterology;  Laterality: N/A;   CORPECTOMY C6     ESOPHAGOGASTRODUODENOSCOPY (EGD) WITH PROPOFOL N/A 01/09/2022   Procedure: ESOPHAGOGASTRODUODENOSCOPY (EGD) WITH PROPOFOL;  Surgeon: Doran Stabler, MD;   Location: [facility];  Service: Gastroenterology;  Laterality: N/A;   TUBAL LIGATION      Social History Social History   Tobacco Use   Smoking status: Never   Smokeless tobacco: Never  Vaping Use   Vaping Use: Never used  Substance Use Topics   Alcohol use: No   Drug use: No    Family History Family History  Problem Relation Age of Onset   Diabetes Mother    Congestive Heart Failure Mother    Hyperlipidemia Father    Hypertension Father    Stroke Father     Allergies  Allergen Reactions   Penicillins Itching    Issue at a young age and accompanied by SOB   Ivp Dye [Iodinated Contrast Media]    Oxycodone-Acetaminophen Nausea And Vomiting     REVIEW OF SYSTEMS (Negative unless checked)  Constitutional: '[]'$ Weight loss  '[]'$ Fever  '[]'$ Chills Cardiac: '[]'$ Chest pain   '[]'$ Chest pressure   '[]'$ Palpitations   '[]'$ Shortness of breath when laying flat   '[]'$ Shortness of breath with exertion. Vascular:  '[]'$ Pain in legs with walking   '[x]'$ Pain in legs with standing  '[]'$ History of DVT   '[]'$ Phlebitis   '[x]'$ Swelling in legs   '[]'$ Varicose veins   '[]'$ Non-healing ulcers Pulmonary:   '[]'$ Uses home oxygen   '[]'$ Productive cough   '[]'$ Hemoptysis   '[]'$ Wheeze  '[]'$ COPD   '[]'$ Asthma Neurologic:  '[]'$   Dizziness   '[]'$ Seizures   '[]'$ History of stroke   '[]'$ History of TIA  '[]'$ Aphasia   '[]'$ Vissual changes   '[x]'$ Weakness or numbness in arm   '[x]'$ Weakness or numbness in leg Musculoskeletal:   '[]'$ Joint swelling   '[]'$ Joint pain   '[]'$ Low back pain Hematologic:  '[]'$ Easy bruising  '[]'$ Easy bleeding   '[]'$ Hypercoagulable state   '[]'$ Anemic Gastrointestinal:  '[]'$ Diarrhea   '[]'$ Vomiting  '[x]'$ Gastroesophageal reflux/heartburn   '[]'$ Difficulty swallowing. Genitourinary:  '[]'$ Chronic kidney disease   '[]'$ Difficult urination  '[]'$ Frequent urination   '[]'$ Blood in urine Skin:  '[]'$ Rashes   '[]'$ Ulcers  Psychological:  '[]'$ History of anxiety   '[]'$  History of major depression.  Physical Examination  There were no vitals filed for this visit. There is no height or weight on file to  calculate BMI. Gen: WD/WN, NAD Head: [place]/AT, No temporalis wasting.  Ear/Nose/Throat: Hearing grossly intact, nares w/o erythema or drainage, pinna without lesions Eyes: PER, EOMI, sclera nonicteric.  Neck: Supple, no gross masses.  No JVD.  Pulmonary:  Good air movement, no audible wheezing, no use of accessory muscles.  Cardiac: RRR, precordium not hyperdynamic. Vascular:  scattered varicosities present bilaterally.  Mild venous stasis changes to the legs bilaterally.  2+ soft pitting edema, CEAP C4sEpAsPr  Vessel Right Left  Radial Palpable Palpable  Gastrointestinal: soft, non-distended. No guarding/no peritoneal signs.  Musculoskeletal: M/S 5/5 throughout.  No deformity.  Neurologic: CN 2-12 intact. Pain and light touch intact in extremities.  Symmetrical.  Speech is fluent. Motor exam as listed above. Psychiatric: Judgment intact, Mood & affect appropriate for pt's clinical situation. Dermatologic: Venous rashes no ulcers noted.  No changes consistent with cellulitis. Lymph : No lichenification or skin changes of chronic lymphedema.  CBC Lab Results  Component Value Date   WBC 5.8 03/09/2022   HGB 13.4 03/09/2022   HCT 41.3 03/09/2022   MCV 92.4 03/09/2022   PLT 174 03/09/2022    BMET    Component Value Date/Time   NA 141 01/12/2022 0832   NA 144 12/06/2016 0000   K 3.0 (L) 01/12/2022 0832   CL 104 01/12/2022 0832   CO2 28 01/12/2022 0832   GLUCOSE 100 (H) 01/12/2022 0832   BUN <5 (L) 01/12/2022 0832   BUN 18 12/06/2016 0000   CREATININE 0.68 01/12/2022 0832   CALCIUM 9.0 01/12/2022 0832   GFRNONAA >60 01/12/2022 0832   GFRAA >60 09/30/2017 0245   CrCl cannot be calculated (Patient's most recent lab result is older than the maximum 21 days allowed.).  COAG Lab Results  Component Value Date   INR 1.4 (H) 01/09/2022   INR 1.9 (H) 01/08/2022   INR 4.0 (H) 01/07/2022    Radiology No results found.   Assessment/Plan 1. Lymphedema Recommend:  No surgery or  intervention at this point in time.    I have reviewed my discussion with the patient regarding lymphedema and why it  causes symptoms.  Patient will continue wearing graduated compression on a daily basis. The patient should put the compression on first thing in the morning and removing them in the evening. The patient should not sleep in the compression.   In addition, behavioral modification throughout the day will be continued.  This will include frequent elevation (such as in a recliner), use of over the counter pain medications as needed and exercise such as walking.  The systemic causes for chronic edema such as liver, kidney and cardiac etiologies does not appear to have significant changed over the past year.  The patient notes the lymph pump is working but as mentioned above she only has 1 sleeve and the sleeve is no longer functioning properly.  We will contact the company to see if we can get replacement sleeves as the lymph pump has proved beneficial for her and its use will continue to improve the edema control and prevent sequela such as ulcers and infections.   The patient will follow-up with me in 6 months.   A total of 30 minutes was spent with this patient and greater than 50% was spent in counseling and coordination of care with the patient.  Discussion included the treatment options for vascular disease including indications for surgery and intervention.  Also discussed is the appropriate timing of treatment.  In addition medical therapy was discussed.   2. Chronic venous insufficiency Recommend:  No surgery or intervention at this point in time.    I have reviewed my discussion with the patient regarding lymphedema and why it  causes symptoms.  Patient will continue wearing graduated compression on a daily basis. The patient should put the compression on first thing in the morning and removing them in the evening. The patient should not sleep in the compression.   In  addition, behavioral modification throughout the day will be continued.  This will include frequent elevation (such as in a recliner), use of over the counter pain medications as needed and exercise such as walking.  The systemic causes for chronic edema such as liver, kidney and cardiac etiologies does not appear to have significant changed over the past year.    The patient notes the lymph pump is working but as mentioned above she only has 1 sleeve and the sleeve is no longer functioning properly.  We will contact the company to see if we can get replacement sleeves as the lymph pump has proved beneficial for her and its use will continue to improve the edema control and prevent sequela such as ulcers and infections.   The patient will follow-up with me in 6 months.   A total of 30 minutes was spent with this patient and greater than 50% was spent in counseling and coordination of care with the patient.  Discussion included the treatment options for vascular disease including indications for surgery and intervention.  Also discussed is the appropriate timing of treatment.  In addition medical therapy was discussed.   3. Essential hypertension Continue antihypertensive medications as already ordered, these medications have been reviewed and there are no changes at this time.  4. Mixed hyperlipidemia Continue statin as ordered and reviewed, no changes at this time  5. Gastroesophageal reflux disease without esophagitis Continue PPI as already ordered, this medication has been reviewed and there are no changes at this time.  Avoidence of caffeine and alcohol  Moderate elevation of the head of the bed     Hortencia Pilar, MD  10/24/2022 10:31 AM

## 2022-10-25 ENCOUNTER — Telehealth (INDEPENDENT_AMBULATORY_CARE_PROVIDER_SITE_OTHER): Payer: Self-pay

## 2022-10-25 ENCOUNTER — Encounter (INDEPENDENT_AMBULATORY_CARE_PROVIDER_SITE_OTHER): Payer: Self-pay | Admitting: Vascular Surgery

## 2022-10-25 ENCOUNTER — Ambulatory Visit (INDEPENDENT_AMBULATORY_CARE_PROVIDER_SITE_OTHER): Payer: Medicare Other | Admitting: Vascular Surgery

## 2022-10-25 VITALS — BP 135/80 | HR 83 | Resp 16 | Wt 212.0 lb

## 2022-10-25 DIAGNOSIS — E782 Mixed hyperlipidemia: Secondary | ICD-10-CM

## 2022-10-25 DIAGNOSIS — I10 Essential (primary) hypertension: Secondary | ICD-10-CM

## 2022-10-25 DIAGNOSIS — K219 Gastro-esophageal reflux disease without esophagitis: Secondary | ICD-10-CM

## 2022-10-25 DIAGNOSIS — I890 Lymphedema, not elsewhere classified: Secondary | ICD-10-CM | POA: Diagnosis not present

## 2022-10-25 DIAGNOSIS — I872 Venous insufficiency (chronic) (peripheral): Secondary | ICD-10-CM

## 2022-10-25 NOTE — Telephone Encounter (Signed)
I received information regarding the patient needing 2 sleeves for her lymph pump because only one is working. I reached out to [place] at [facility] and per his notes she received 2 sleeves when the pump was brought out. I had the same incident and call to take place exactly 1 year ago. Per Georgina Snell he was going to reach out to the patient.

## 2022-10-30 ENCOUNTER — Encounter (INDEPENDENT_AMBULATORY_CARE_PROVIDER_SITE_OTHER): Payer: Self-pay | Admitting: Vascular Surgery

## 2022-11-14 IMAGING — MR MR LUMBAR SPINE W/O CM
4 of 5 series · 19 of 48 positions shown · non-contrast
Comparison: 03/21/2020.

CLINICAL DATA: Muscle weakness, tail bone pain. Bilateral lower
extremity pain, weakness and numbness.

EXAM:
MRI LUMBAR SPINE WITHOUT CONTRAST
TECHNIQUE: Multiplanar, multisequence MR imaging of the lumbar spine was
performed. No intravenous contrast was administered.

[Series 7: T2 · sagittal · 4.0mm · 0.73mm/px · 7 of 17 slices shown (1 of 2)]
[im 1/17]
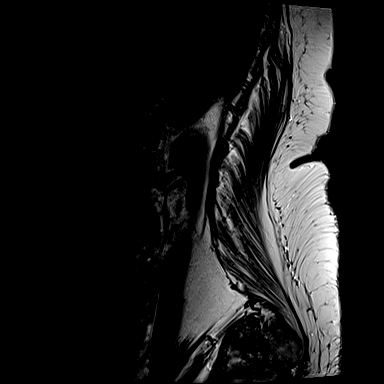
[im 3/17]
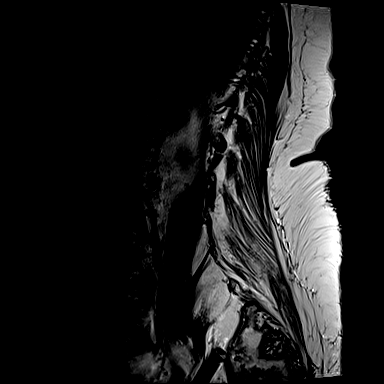
[im 6/17]
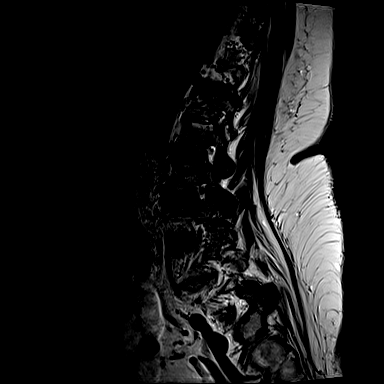
[im 9/17]
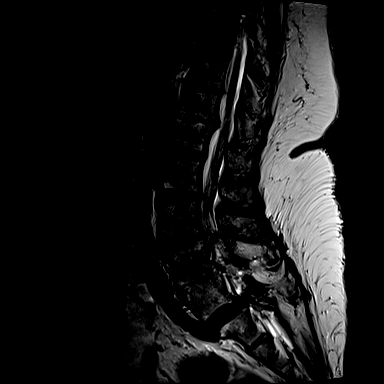
[im 11/17]
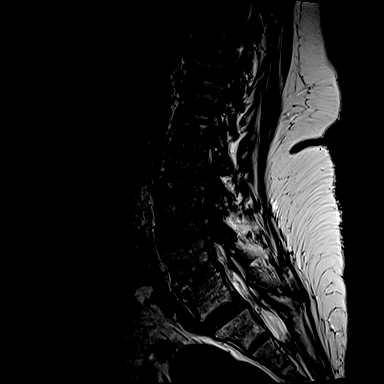
[im 14/17]
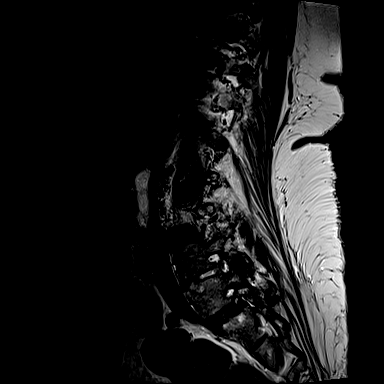
[im 17/17]
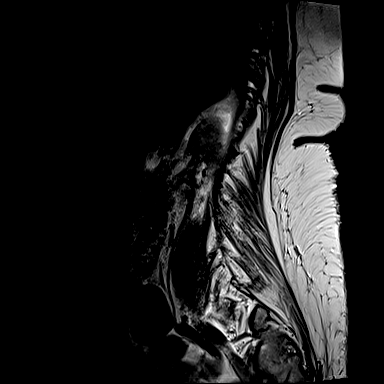

[Series 8: T1 · sagittal · 4.0mm · 0.88mm/px · 3 of 17 slices shown (1 of 2)]
[im 3/17]
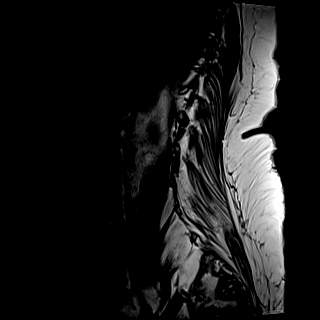
[im 9/17]
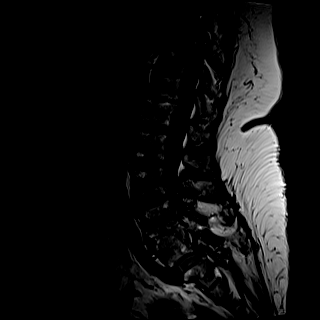
[im 14/17]
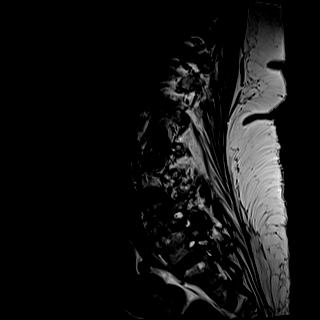

[Series 14: T2 · axial · 4.0mm · 0.28mm/px · z∈[-22,+155]mm · 6 of 38 slices shown (2 of 2)]
[im 1/38]
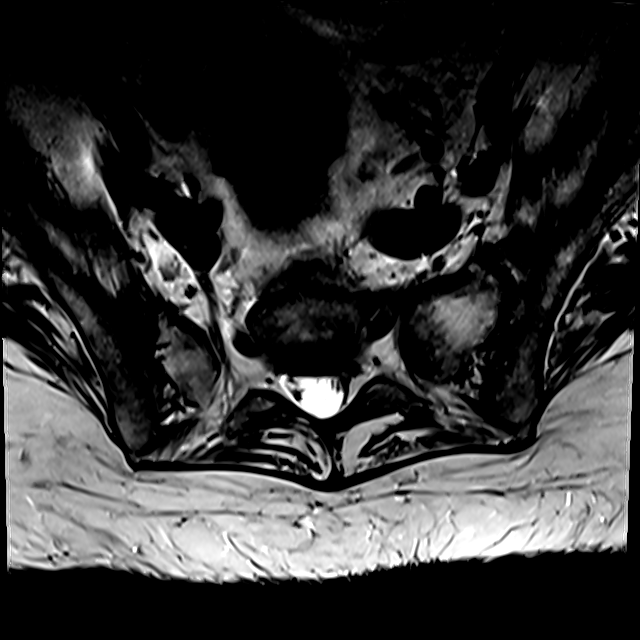
[im 6/38]
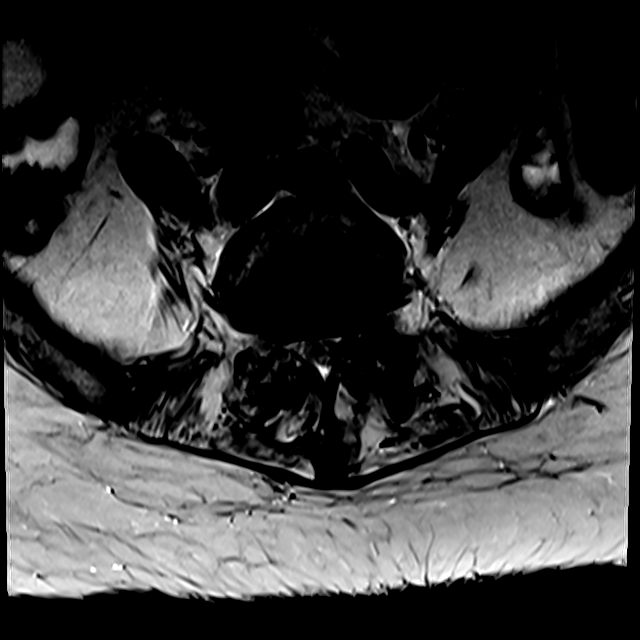
[im 12/38]
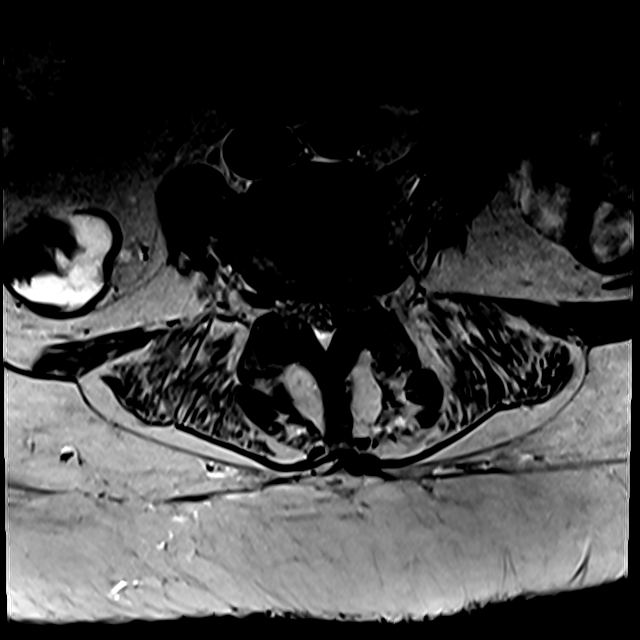
[im 18/38]
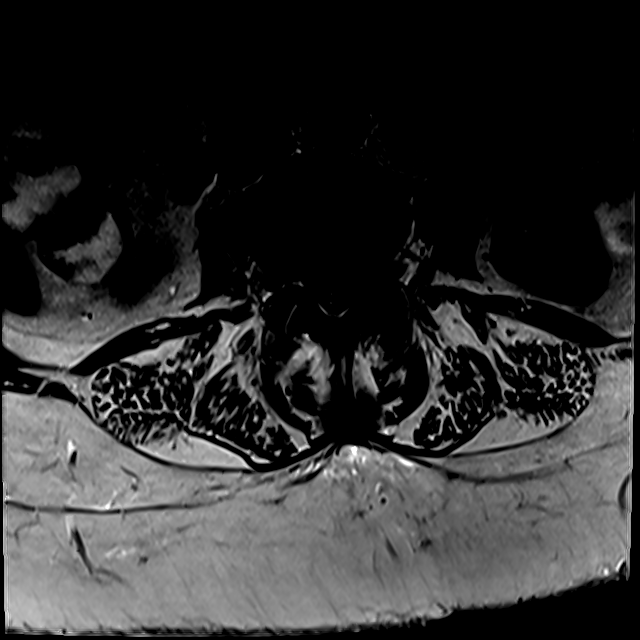
[im 20/38]
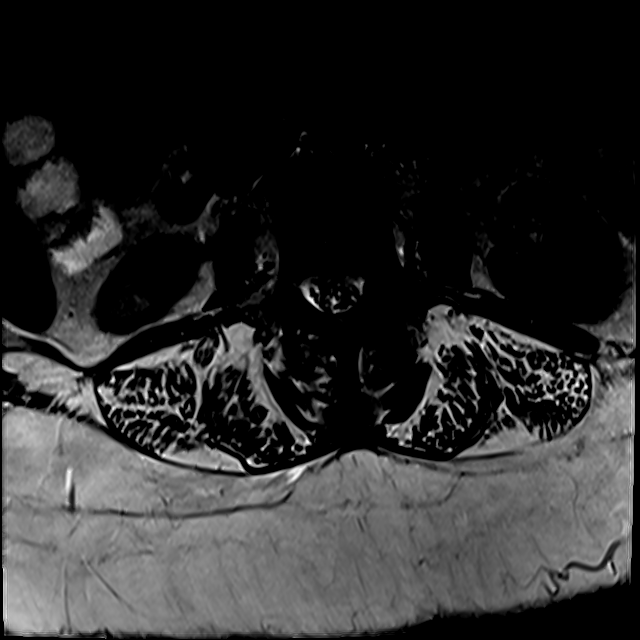
[im 32/38]
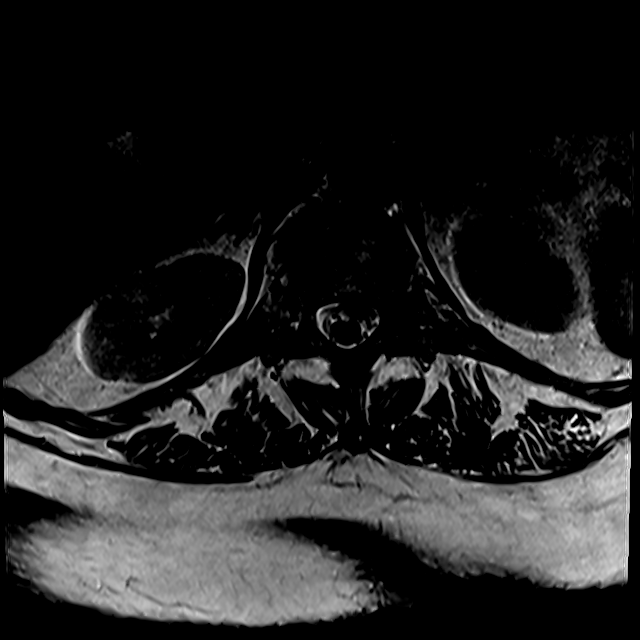

[Series 100: T1 · axial · 4.0mm · 0.28mm/px · z∈[+2,+155]mm · 3 of 38 slices shown (2 of 2)]
[im 6/38]
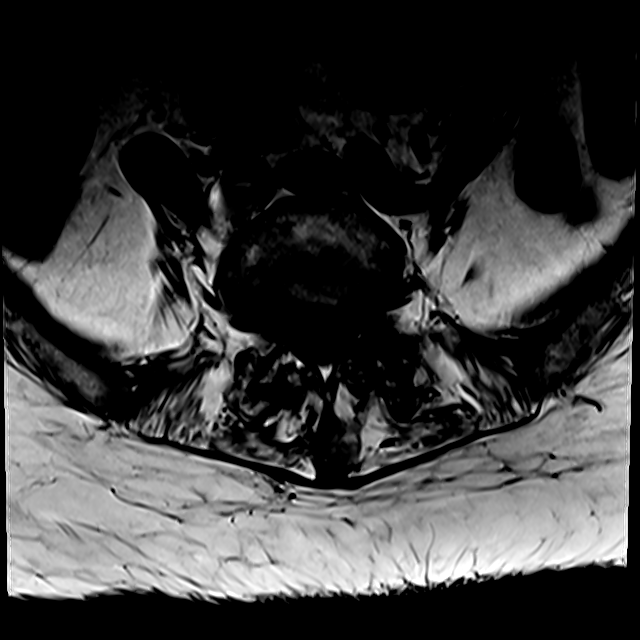
[im 20/38]
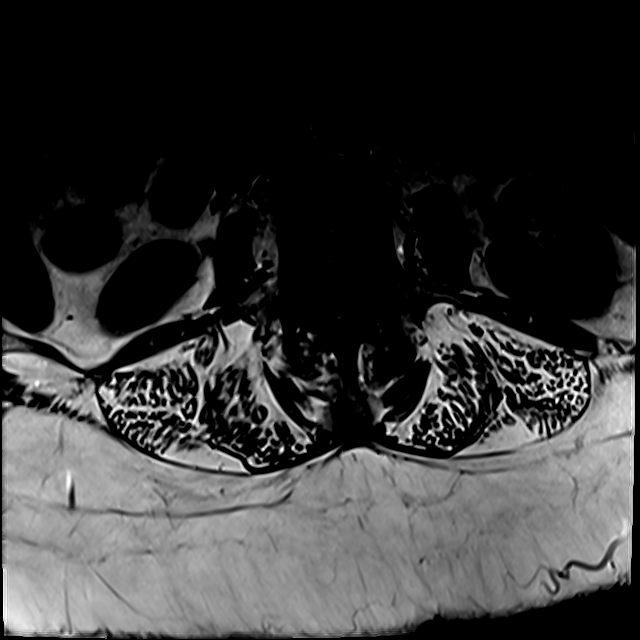
[im 32/38]
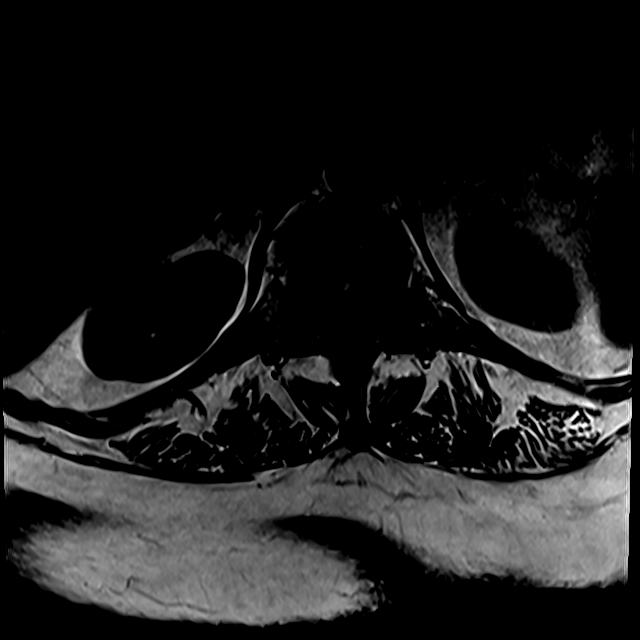

[19 of 48 positions shown; findings below may reference images not displayed]

FINDINGS: Segmentation: Transitional lumbosacral anatomy with L5
sacralization.

Alignment: Trace L2-3 anterolisthesis. Grade 1 L4-5 anterolisthesis.

Vertebrae: Modic type 2 endplate degenerative changes and scattered
hemangiomata versus focal fat. No fracture or aggressive osseous
lesion. Partial fusion at the L3-4 level.

Conus medullaris and cauda equina: Conus extends to the L1 level.
Conus and cauda equina appear normal.

Disc levels: Multilevel desiccation.

T11-12: Disc osteophyte complex partially effacing the ventral CSF
containing spaces. Bilateral facet hypertrophy. Mild spinal canal
and bilateral neural foraminal narrowing.

T12-L1: No significant disc bulge. Bilateral facet hypertrophy.
Patent spinal canal and neural foramen.

L1-2: Disc bulge with superimposed right greater than left foraminal
protrusions. Bilateral facet hypertrophy and ligamentum flavum
thickening. Mild spinal canal and bilateral neural foraminal
narrowing.

L2-3: Mild disc bulge, ligamentum flavum thickening and bilateral
hypertrophy. Mild spinal and moderate bilateral neural foraminal
narrowing.

L3-4: Disc bulge with shallow left foraminal protrusion. Bilateral
hypertrophy. Fusion of the left facet joint. Moderate spinal canal,
mild right and moderate left neural foraminal narrowing.

L4-5: Disc bulge and bilateral hypertrophy. Mild spinal canal and
moderate bilateral neural foraminal narrowing.

L5-S1: No significant disc bulge. Patent spinal canal neural
foramen.

Paraspinal and other soft tissues: Small bilateral renal cysts.
IMPRESSION: Moderate L3-4 and T11-12, L1-3 and L4-5 spinal canal narrowing.

Moderate bilateral L2-3 left L3-4 and bilateral L4-5 neural
foraminal narrowing.

## 2023-02-03 ENCOUNTER — Other Ambulatory Visit: Payer: Self-pay | Admitting: Internal Medicine

## 2023-02-03 DIAGNOSIS — R0602 Shortness of breath: Secondary | ICD-10-CM

## 2023-02-23 ENCOUNTER — Ambulatory Visit
Admission: RE | Admit: 2023-02-23 | Discharge: 2023-02-23 | Disposition: A | Discharge status: Home / Self Care | Payer: Medicare Other | Source: Ambulatory Visit | Attending: Internal Medicine | Admitting: Internal Medicine

## 2023-02-23 DIAGNOSIS — I10 Essential (primary) hypertension: Secondary | ICD-10-CM | POA: Diagnosis not present

## 2023-02-23 DIAGNOSIS — R0600 Dyspnea, unspecified: Secondary | ICD-10-CM | POA: Insufficient documentation

## 2023-02-23 DIAGNOSIS — R0602 Shortness of breath: Secondary | ICD-10-CM | POA: Diagnosis present

## 2023-02-23 DIAGNOSIS — I081 Rheumatic disorders of both mitral and tricuspid valves: Secondary | ICD-10-CM | POA: Insufficient documentation

## 2023-02-23 DIAGNOSIS — E785 Hyperlipidemia, unspecified: Secondary | ICD-10-CM | POA: Insufficient documentation

## 2023-02-23 DIAGNOSIS — G8250 Quadriplegia, unspecified: Secondary | ICD-10-CM | POA: Insufficient documentation

## 2023-02-23 NOTE — Progress Notes (Signed)
*  PRELIMINARY RESULTS* Echocardiogram 2D Echocardiogram has been performed.  Brandy Edwards 02/23/2023, 10:54 AM

## 2023-02-24 LAB — ECHOCARDIOGRAM COMPLETE
AR max vel: 2.19 cm2
AV Area VTI: 2.21 cm2
AV Area mean vel: 2.24 cm2
AV Mean grad: 3 mmHg
AV Peak grad: 6.6 mmHg
Ao pk vel: 1.28 m/s
Area-P 1/2: 3.76 cm2
MV VTI: 2.1 cm2
S' Lateral: 2.4 cm

## 2023-04-25 ENCOUNTER — Ambulatory Visit (INDEPENDENT_AMBULATORY_CARE_PROVIDER_SITE_OTHER): Payer: Medicare Other | Admitting: Vascular Surgery

## 2023-05-09 ENCOUNTER — Ambulatory Visit (INDEPENDENT_AMBULATORY_CARE_PROVIDER_SITE_OTHER): Payer: Medicare Other | Admitting: Vascular Surgery

## 2023-05-19 ENCOUNTER — Ambulatory Visit (INDEPENDENT_AMBULATORY_CARE_PROVIDER_SITE_OTHER): Payer: Medicare Other | Admitting: Vascular Surgery

## 2023-05-23 ENCOUNTER — Ambulatory Visit (INDEPENDENT_AMBULATORY_CARE_PROVIDER_SITE_OTHER): Payer: Medicare Other | Admitting: Nurse Practitioner

## 2023-05-23 ENCOUNTER — Encounter (INDEPENDENT_AMBULATORY_CARE_PROVIDER_SITE_OTHER): Payer: Self-pay | Admitting: Nurse Practitioner

## 2023-05-23 VITALS — BP 161/83 | HR 68 | Resp 16

## 2023-05-23 DIAGNOSIS — I10 Essential (primary) hypertension: Secondary | ICD-10-CM

## 2023-05-23 DIAGNOSIS — E782 Mixed hyperlipidemia: Secondary | ICD-10-CM | POA: Diagnosis not present

## 2023-05-23 DIAGNOSIS — I890 Lymphedema, not elsewhere classified: Secondary | ICD-10-CM

## 2023-05-23 NOTE — Progress Notes (Signed)
Subjective:    Patient ID: Brandy Edwards, female    DOB: 03/02/1946, 77 y.o.   MRN: [id number] Chief Complaint  Patient presents with   Follow-up    6 month follow up    The patient returns today for follow-up evaluation of her lymphedema.  The patient is currently a quadriplegic but has issues with lower extremity edema.  At her last visit follow-up was ordered but she notes that she has not yet received 1.  She had 1 previously in the past we only have 1 sleeve.  She notes that this was helpful for controlling her lower extremity edema until she venture to her nursing facility.  She does not currently does not work properly.  She has been utilizing TED hose is much as possible.  In addition she started to develop some pain discomfort in the leg as well which certainly may be related to have some of the swelling     Review of Systems  Cardiovascular:  Positive for leg swelling.  Neurological:  Positive for weakness.  All other systems reviewed and are negative.      Objective:   Physical Exam Vitals reviewed.  HENT:     Head: Normocephalic.  Cardiovascular:     Rate and Rhythm: Normal rate.  Pulmonary:     Effort: Pulmonary effort is normal.  Musculoskeletal:     Right lower leg: 1+ Edema present.     Left lower leg: 2+ Edema present.  Skin:    General: Skin is warm and dry.  Neurological:     Mental Status: She is alert and oriented to person, place, and time.  Psychiatric:        Mood and Affect: Mood normal.        Behavior: Behavior normal.        Thought Content: Thought content normal.        Judgment: Judgment normal.     BP (!) 161/83 (BP Location: Left Arm)   Pulse 68   Resp 16   LMP  (LMP Unknown)   Past Medical History:  Diagnosis Date   Acute deep vein thrombosis (DVT) of distal end of left lower extremity (HCC)    Bilateral corneal abrasions    Chronic bronchitis (HCC)    Chronic pain disorder 05/16/2019   Depressed    Dysphagia    Fusion of  spine, cervical region    Gout    Hypertension    Injury to ligament of cervical spine 05/2016   Neuropathy     Social History   Socioeconomic History   Marital status: Legally Separated    Spouse name: Not on file   Number of children: Not on file   Years of education: Not on file   Highest education level: Not on file  Occupational History   Not on file  Tobacco Use   Smoking status: Never   Smokeless tobacco: Never  Vaping Use   Vaping status: Never Used  Substance and Sexual Activity   Alcohol use: No   Drug use: No   Sexual activity: Not on file  Other Topics Concern   Not on file  Social History Narrative   Lives at [facility] # [phone]   Social Determinants of Health   Financial Resource Strain: Not on file  Food Insecurity: Not on file  Transportation Needs: Not on file  Physical Activity: Not on file  Stress: Not on file  Social Connections: Not on file  Intimate Partner  Violence: Not on file    Past Surgical History:  Procedure Laterality Date   CESAREAN SECTION     CHOLECYSTECTOMY     COLONOSCOPY WITH PROPOFOL N/A 01/11/2022   Procedure: COLONOSCOPY WITH PROPOFOL;  Surgeon: Shellia Cleverly, DO;  Location: [facility];  Service: Gastroenterology;  Laterality: N/A;   CORPECTOMY C6     ESOPHAGOGASTRODUODENOSCOPY (EGD) WITH PROPOFOL N/A 01/09/2022   Procedure: ESOPHAGOGASTRODUODENOSCOPY (EGD) WITH PROPOFOL;  Surgeon: Sherrilyn Rist, MD;  Location: [facility];  Service: Gastroenterology;  Laterality: N/A;   TUBAL LIGATION      Family History  Problem Relation Age of Onset   Diabetes Mother    Congestive Heart Failure Mother    Hyperlipidemia Father    Hypertension Father    Stroke Father     Allergies  Allergen Reactions   Penicillins Itching    Issue at a young age and accompanied by SOB   Ivp Dye [Iodinated Contrast Media]    Oxycodone-Acetaminophen Nausea And Vomiting       Latest Ref Rng & Units 03/09/2022    9:00 AM  01/12/2022    8:32 AM 01/10/2022    8:31 AM  CBC  WBC 4.0 - 10.5 K/uL 5.8  7.9  7.9   Hemoglobin 12.0 - 15.0 g/dL 40.9  9.4  8.3   Hematocrit 36.0 - 46.0 % 41.3  30.2  26.7   Platelets 150 - 400 K/uL 174  321  257       CMP     Component Value Date/Time   NA 141 01/12/2022 0832   NA 144 12/06/2016 0000   K 3.0 (L) 01/12/2022 0832   CL 104 01/12/2022 0832   CO2 28 01/12/2022 0832   GLUCOSE 100 (H) 01/12/2022 0832   BUN <5 (L) 01/12/2022 0832   BUN 18 12/06/2016 0000   CREATININE 0.68 01/12/2022 0832   CALCIUM 9.0 01/12/2022 0832   PROT 6.2 (L) 01/07/2022 0217   ALBUMIN 2.9 (L) 01/07/2022 0217   AST 19 01/07/2022 0217   ALT 15 01/07/2022 0217   ALKPHOS 65 01/07/2022 0217   BILITOT 0.9 01/07/2022 0217   GFRNONAA >60 01/12/2022 0832     No results found.     Assessment & Plan:   1. Lymphedema Recommend:  No surgery or intervention at this point in time.   The Patient is CEAP C4sEpAsPr.  The patient has been wearing compression for more than 12 weeks with no or little benefit.  The patient has been exercising daily for more than 12 weeks. The patient has been elevating and taking OTC pain medications for more than 12 weeks.  None of these have have eliminated the pain related to the lymphedema or the discomfort regarding excessive swelling and venous congestion.    I have reviewed my discussion with the patient regarding lymphedema and why it  causes symptoms.  Patient will continue wearing graduated compression on a daily basis. The patient should put the compression on first thing in the morning and removing them in the evening. The patient should not sleep in the compression.   In addition, behavioral modification throughout the day will be continued.  This will include frequent elevation (such as in a recliner), use of over the counter pain medications as needed and exercise such as walking.  The systemic causes for chronic edema such as liver, kidney and cardiac  etiologies do not appear to have significant changed over the past year.    The patient has  chronic , severe lymphedema with hyperpigmentation of the skin and has done MLD, skin care, medication, diet, exercise, elevation and compression for 4 weeks with no improvement,  I am recommending a lymphedema pump.  The patient still has stage 3 lymphedema and therefore, I believe that a lymph pump is needed to improve the control of the patient's lymphedema and improve the quality of life.  Additionally, a lymph pump is warranted because it will reduce the risk of cellulitis and ulceration in the future.  Patient should follow-up in six months   2. Essential hypertension Continue antihypertensive medications as already ordered, these medications have been reviewed and there are no changes at this time.  3. Mixed hyperlipidemia Continue statin as ordered and reviewed, no changes at this time   Current Outpatient Medications on File Prior to Visit  Medication Sig Dispense Refill   acetaminophen (TYLENOL) 325 MG tablet Take 325 mg by mouth 2 (two) times daily as needed for moderate pain, headache or fever.     allopurinol (ZYLOPRIM) 100 MG tablet Take 100 mg by mouth daily.      amLODipine (NORVASC) 10 MG tablet Take 10 mg by mouth daily.      atorvastatin (LIPITOR) 80 MG tablet Take 80 mg by mouth at bedtime.     baclofen (LIORESAL) 10 MG tablet Take 10 mg by mouth 2 (two) times daily.     budesonide (PULMICORT) 0.5 MG/2ML nebulizer solution Take 0.5 mg by nebulization 2 (two) times daily.     chlorhexidine (PERIDEX) 0.12 % solution Use as directed 30 mLs in the mouth or throat 2 (two) times daily.     ferrous sulfate 325 (65 FE) MG EC tablet Take 325 mg by mouth daily with breakfast.     fluticasone (FLONASE) 50 MCG/ACT nasal spray Place 2 sprays into both nostrils daily.     furosemide (LASIX) 40 MG tablet Take 40 mg by mouth daily.     levETIRAcetam (KEPPRA) 500 MG tablet Take 1 tablet in the  morning, take 2 at bedtime (Patient taking differently: Take 500-1,000 mg by mouth See admin instructions. 500 mg in the morning 1000 mg at bedtime) 90 tablet 5   lidocaine 4 % Place 1 patch onto the skin daily. Apply to each shoulder     loperamide (IMODIUM) 2 MG capsule Take 2 mg by mouth as needed for diarrhea or loose stools.     lubiprostone (AMITIZA) 24 MCG capsule Take 24 mcg by mouth 2 (two) times daily with a meal.     Melatonin 10 MG TABS Take 10 mg by mouth at bedtime.     Menthol, Topical Analgesic, (BIOFREEZE) 4 % GEL Apply 1 application. topically at bedtime. Right shoulder     metoprolol succinate (TOPROL-XL) 25 MG 24 hr tablet Take 25 mg by mouth daily.     montelukast (SINGULAIR) 10 MG tablet Take 10 mg by mouth at bedtime.      Multiple Vitamin (MULTI-VITAMINS) TABS Take 1 tablet by mouth daily.     nystatin (MYCOSTATIN/NYSTOP) powder Apply 1 application. topically 3 (three) times daily as needed (rash on body folds and left underarm).     pantoprazole (PROTONIX) 40 MG tablet Take 40 mg by mouth 2 (two) times daily.     polyethylene glycol (MIRALAX / GLYCOLAX) 17 g packet Take 17 g by mouth daily as needed for mild constipation.     potassium chloride (KLOR-CON) 10 MEQ tablet Take 10 mEq by mouth daily.  pregabalin (LYRICA) 75 MG capsule Take 75 mg by mouth 2 (two) times daily.     RESTASIS 0.05 % ophthalmic emulsion      rivaroxaban (XARELTO) 20 MG TABS tablet Take 20 mg by mouth daily with supper.      sucralfate (CARAFATE) 1 g tablet Take 1 g by mouth 4 (four) times daily.     traMADol-acetaminophen (ULTRACET) 37.5-325 MG tablet Take 2 tablets by mouth 3 (three) times daily. 30 tablet 0   VITAMIN D PO Take 50,000 Units by mouth every 30 (thirty) days. 1st of the month     Zinc Oxide 22 % CREA Apply 1 application. topically every 4 (four) hours.     ezetimibe (ZETIA) 10 MG tablet Take 10 mg by mouth daily. (Patient not taking: Reported on 10/25/2022)      sennosides-docusate sodium (SENOKOT-S) 8.6-50 MG tablet Take 2 tablets by mouth at bedtime. (Patient not taking: Reported on 10/25/2022)     simethicone (MYLICON) 80 MG chewable tablet Chew 80 mg by mouth every 6 (six) hours as needed for flatulence. (Patient not taking: Reported on 10/25/2022)     No current facility-administered medications on file prior to visit.    There are no Patient Instructions on file for this visit. No follow-ups on file.   Georgiana Spinner, NP

## 2023-05-26 ENCOUNTER — Telehealth (INDEPENDENT_AMBULATORY_CARE_PROVIDER_SITE_OTHER): Payer: Self-pay

## 2023-05-26 NOTE — Telephone Encounter (Signed)
I received this from Sherlyn Lick at [employer]:  Sharlene Motts, I wanted to let you know that this patient is in a skilled nursing facility, so we would get denied attempting to process for a pump with insurance. We would have to ask the family or patient to self pay. FYI.    Kristie Cowman Sr. Territory Manager [facility]  Me:  Susy Frizzle,   Thanks for letting me know! Do I need to reach out to the patient or family?   Thank you, Marcelina Morel:  We can do that. We'll make them aware.   I just wanna make sure that you and the physician were aware. Thank you.

## 2023-11-22 ENCOUNTER — Encounter (INDEPENDENT_AMBULATORY_CARE_PROVIDER_SITE_OTHER): Payer: Self-pay | Admitting: Nurse Practitioner

## 2023-11-22 ENCOUNTER — Ambulatory Visit (INDEPENDENT_AMBULATORY_CARE_PROVIDER_SITE_OTHER): Payer: Medicare Other | Admitting: Nurse Practitioner

## 2023-11-22 VITALS — BP 139/72 | HR 60 | Resp 16 | Wt 214.0 lb

## 2023-11-22 DIAGNOSIS — I10 Essential (primary) hypertension: Secondary | ICD-10-CM

## 2023-11-22 DIAGNOSIS — E782 Mixed hyperlipidemia: Secondary | ICD-10-CM | POA: Diagnosis not present

## 2023-11-22 DIAGNOSIS — I890 Lymphedema, not elsewhere classified: Secondary | ICD-10-CM

## 2023-11-27 ENCOUNTER — Encounter (INDEPENDENT_AMBULATORY_CARE_PROVIDER_SITE_OTHER): Payer: Self-pay | Admitting: Nurse Practitioner

## 2023-11-27 NOTE — Progress Notes (Signed)
 Subjective:    Patient ID: Brandy Edwards, female    DOB: 08-01-1946, 78 y.o.   MRN: [id number] Chief Complaint  Patient presents with   Follow-up    6 month follow up    The patient returns today for follow-up evaluation of her lymphedema.  The patient is currently a quadriplegic but has issues with lower extremity edema.  At her last visit follow-up was ordered but she notes that she has not yet received 1.  She had 1 previously in the past we only have 1 sleeve.  She notes that this was helpful for controlling her lower extremity edema until she venture to her nursing facility.  She does not currently does not work properly.  She has been utilizing TED hose is much as possible.  In addition she started to develop some pain discomfort in the leg as well which certainly may be related to have some of the swelling     Review of Systems  Cardiovascular:  Positive for leg swelling.  Neurological:  Positive for weakness.  All other systems reviewed and are negative.      Objective:   Physical Exam Vitals reviewed.  HENT:     Head: Normocephalic.  Cardiovascular:     Rate and Rhythm: Normal rate.  Pulmonary:     Effort: Pulmonary effort is normal.  Musculoskeletal:     Right lower leg: 1+ Edema present.     Left lower leg: 2+ Edema present.  Skin:    General: Skin is warm and dry.  Neurological:     Mental Status: She is alert and oriented to person, place, and time.  Psychiatric:        Mood and Affect: Mood normal.        Behavior: Behavior normal.        Thought Content: Thought content normal.        Judgment: Judgment normal.     BP 139/72   Pulse 60   Resp 16   Wt 214 lb (97.1 kg)   LMP  (LMP Unknown)   BMI 41.79 kg/m   Past Medical History:  Diagnosis Date   Acute deep vein thrombosis (DVT) of distal end of left lower extremity (HCC)    Bilateral corneal abrasions    Chronic bronchitis (HCC)    Chronic pain disorder 05/16/2019   Depressed    Dysphagia     Fusion of spine, cervical region    Gout    Hypertension    Injury to ligament of cervical spine 05/2016   Neuropathy     Social History   Socioeconomic History   Marital status: Legally Separated    Spouse name: Not on file   Number of children: Not on file   Years of education: Not on file   Highest education level: Not on file  Occupational History   Not on file  Tobacco Use   Smoking status: Never   Smokeless tobacco: Never  Vaping Use   Vaping status: Never Used  Substance and Sexual Activity   Alcohol use: No   Drug use: No   Sexual activity: Not on file  Other Topics Concern   Not on file  Social History Narrative   Lives at [facility] [phone]   Social Drivers of Health   Financial Resource Strain: Not on file  Food Insecurity: Not on file  Transportation Needs: Not on file  Physical Activity: Not on file  Stress: Not on file  Social  Connections: Not on file  Intimate Partner Violence: Not on file    Past Surgical History:  Procedure Laterality Date   CESAREAN SECTION     CHOLECYSTECTOMY     COLONOSCOPY WITH PROPOFOL N/A 01/11/2022   Procedure: COLONOSCOPY WITH PROPOFOL;  Surgeon: Shellia Cleverly, DO;  Location: [facility] ENDOSCOPY;  Service: Gastroenterology;  Laterality: N/A;   CORPECTOMY C6     ESOPHAGOGASTRODUODENOSCOPY (EGD) WITH PROPOFOL N/A 01/09/2022   Procedure: ESOPHAGOGASTRODUODENOSCOPY (EGD) WITH PROPOFOL;  Surgeon: Sherrilyn Rist, MD;  Location: [facility] ENDOSCOPY;  Service: Gastroenterology;  Laterality: N/A;   TUBAL LIGATION      Family History  Problem Relation Age of Onset   Diabetes Mother    Congestive Heart Failure Mother    Hyperlipidemia Father    Hypertension Father    Stroke Father     Allergies  Allergen Reactions   Penicillins Itching    Issue at a young age and accompanied by SOB   Ivp Dye [Iodinated Contrast Media]    Oxycodone-Acetaminophen Nausea And Vomiting       Latest Ref Rng & Units 03/09/2022     9:00 AM 01/12/2022    8:32 AM 01/10/2022    8:31 AM  CBC  WBC 4.0 - 10.5 K/uL 5.8  7.9  7.9   Hemoglobin 12.0 - 15.0 g/dL 16.1  9.4  8.3   Hematocrit 36.0 - 46.0 % 41.3  30.2  26.7   Platelets 150 - 400 K/uL 174  321  257       CMP     Component Value Date/Time   NA 141 01/12/2022 0832   NA 144 12/06/2016 0000   K 3.0 (L) 01/12/2022 0832   CL 104 01/12/2022 0832   CO2 28 01/12/2022 0832   GLUCOSE 100 (H) 01/12/2022 0832   BUN <5 (L) 01/12/2022 0832   BUN 18 12/06/2016 0000   CREATININE 0.68 01/12/2022 0832   CALCIUM 9.0 01/12/2022 0832   PROT 6.2 (L) 01/07/2022 0217   ALBUMIN 2.9 (L) 01/07/2022 0217   AST 19 01/07/2022 0217   ALT 15 01/07/2022 0217   ALKPHOS 65 01/07/2022 0217   BILITOT 0.9 01/07/2022 0217   GFRNONAA >60 01/12/2022 0832     No results found.     Assessment & Plan:   1. Lymphedema Recommend:  No surgery or intervention at this point in time.   The Patient is CEAP [id number].  The patient has been wearing compression for more than 12 weeks with no or little benefit.  The patient has been exercising daily for more than 12 weeks. The patient has been elevating and taking OTC pain medications for more than 12 weeks.  None of these have have eliminated the pain related to the lymphedema or the discomfort regarding excessive swelling and venous congestion.    I have reviewed my discussion with the patient regarding lymphedema and why it  causes symptoms.  Patient will continue wearing graduated compression on a daily basis. The patient should put the compression on first thing in the morning and removing them in the evening. The patient should not sleep in the compression.   In addition, behavioral modification throughout the day will be continued.  This will include frequent elevation (such as in a recliner), use of over the counter pain medications as needed and exercise such as walking.  The systemic causes for chronic edema such as liver, kidney and  cardiac etiologies do not appear to have significant changed over the past  year.    The patient has chronic , severe lymphedema with hyperpigmentation of the skin and has done MLD, skin care, medication, diet, exercise, elevation and compression for 4 weeks with no improvement,  I am recommending a lymphedema pump.  The patient still has stage 3 lymphedema and therefore, I believe that a lymph pump is needed to improve the control of the patient's lymphedema and improve the quality of life.  Additionally, a lymph pump is warranted because it will reduce the risk of cellulitis and ulceration in the future.  Patient should follow-up in six months   2. Essential hypertension Continue antihypertensive medications as already ordered, these medications have been reviewed and there are no changes at this time.  3. Mixed hyperlipidemia Continue statin as ordered and reviewed, no changes at this time   Current Outpatient Medications on File Prior to Visit  Medication Sig Dispense Refill   acetaminophen (TYLENOL) 325 MG tablet Take 325 mg by mouth 2 (two) times daily as needed for moderate pain, headache or fever.     allopurinol (ZYLOPRIM) 100 MG tablet Take 100 mg by mouth daily.      amLODipine (NORVASC) 10 MG tablet Take 10 mg by mouth daily.      atorvastatin (LIPITOR) 80 MG tablet Take 80 mg by mouth at bedtime.     baclofen (LIORESAL) 10 MG tablet Take 10 mg by mouth 2 (two) times daily.     budesonide (PULMICORT) 0.5 MG/2ML nebulizer solution Take 0.5 mg by nebulization 2 (two) times daily.     chlorhexidine (PERIDEX) 0.12 % solution Use as directed 30 mLs in the mouth or throat 2 (two) times daily.     ferrous sulfate 325 (65 FE) MG EC tablet Take 325 mg by mouth daily with breakfast.     fluticasone (FLONASE) 50 MCG/ACT nasal spray Place 2 sprays into both nostrils daily.     furosemide (LASIX) 40 MG tablet Take 40 mg by mouth daily.     levETIRAcetam (KEPPRA) 500 MG tablet Take 1 tablet in  the morning, take 2 at bedtime (Patient taking differently: Take 500-1,000 mg by mouth See admin instructions. 500 mg in the morning 1000 mg at bedtime) 90 tablet 5   lidocaine 4 % Place 1 patch onto the skin daily. Apply to each shoulder     loperamide (IMODIUM) 2 MG capsule Take 2 mg by mouth as needed for diarrhea or loose stools.     lubiprostone (AMITIZA) 24 MCG capsule Take 24 mcg by mouth 2 (two) times daily with a meal.     Melatonin 10 MG TABS Take 10 mg by mouth at bedtime.     Menthol, Topical Analgesic, (BIOFREEZE) 4 % GEL Apply 1 application. topically at bedtime. Right shoulder     metoprolol succinate (TOPROL-XL) 25 MG 24 hr tablet Take 25 mg by mouth daily.     montelukast (SINGULAIR) 10 MG tablet Take 10 mg by mouth at bedtime.      Multiple Vitamin (MULTI-VITAMINS) TABS Take 1 tablet by mouth daily.     nystatin (MYCOSTATIN/NYSTOP) powder Apply 1 application. topically 3 (three) times daily as needed (rash on body folds and left underarm).     pantoprazole (PROTONIX) 40 MG tablet Take 40 mg by mouth 2 (two) times daily.     polyethylene glycol (MIRALAX / GLYCOLAX) 17 g packet Take 17 g by mouth daily as needed for mild constipation.     potassium chloride (KLOR-CON) 10 MEQ tablet Take 10 mEq  by mouth daily.     pregabalin (LYRICA) 75 MG capsule Take 75 mg by mouth 2 (two) times daily.     RESTASIS 0.05 % ophthalmic emulsion      rivaroxaban (XARELTO) 20 MG TABS tablet Take 20 mg by mouth daily with supper.      sucralfate (CARAFATE) 1 g tablet Take 1 g by mouth 4 (four) times daily.     traMADol-acetaminophen (ULTRACET) 37.5-325 MG tablet Take 2 tablets by mouth 3 (three) times daily. 30 tablet 0   VITAMIN D PO Take 50,000 Units by mouth every 30 (thirty) days. 1st of the month     Zinc Oxide 22 % CREA Apply 1 application. topically every 4 (four) hours.     ezetimibe (ZETIA) 10 MG tablet Take 10 mg by mouth daily. (Patient not taking: Reported on 10/25/2022)      sennosides-docusate sodium (SENOKOT-S) 8.6-50 MG tablet Take 2 tablets by mouth at bedtime. (Patient not taking: Reported on 10/25/2022)     simethicone (MYLICON) 80 MG chewable tablet Chew 80 mg by mouth every 6 (six) hours as needed for flatulence. (Patient not taking: Reported on 10/25/2022)     No current facility-administered medications on file prior to visit.    There are no Patient Instructions on file for this visit. No follow-ups on file.   Georgiana Spinner, NP

## 2023-12-01 ENCOUNTER — Telehealth (INDEPENDENT_AMBULATORY_CARE_PROVIDER_SITE_OTHER): Payer: Self-pay

## 2023-12-01 NOTE — Telephone Encounter (Signed)
 This was received from Sherlyn Lick at [facility] :  Cleone Slim Vernona Rieger, this patient still only has insurance that covers her staying in a nursing home. We will contact facility and family again to see if they would like to purchase.     Kristie Cowman  Sr. Media planner  [employer]

## 2024-02-18 ENCOUNTER — Encounter (HOSPITAL_COMMUNITY): Payer: Self-pay

## 2024-02-18 ENCOUNTER — Emergency Department (HOSPITAL_COMMUNITY)

## 2024-02-18 ENCOUNTER — Observation Stay (HOSPITAL_COMMUNITY): Admission: EM | Admit: 2024-02-18 | Discharge: 2024-02-19 | Attending: Student | Admitting: Student

## 2024-02-18 ENCOUNTER — Other Ambulatory Visit: Payer: Self-pay

## 2024-02-18 DIAGNOSIS — G8929 Other chronic pain: Secondary | ICD-10-CM | POA: Diagnosis not present

## 2024-02-18 DIAGNOSIS — Z8669 Personal history of other diseases of the nervous system and sense organs: Secondary | ICD-10-CM

## 2024-02-18 DIAGNOSIS — G40909 Epilepsy, unspecified, not intractable, without status epilepticus: Secondary | ICD-10-CM

## 2024-02-18 DIAGNOSIS — J45909 Unspecified asthma, uncomplicated: Secondary | ICD-10-CM | POA: Diagnosis not present

## 2024-02-18 DIAGNOSIS — R8281 Pyuria: Secondary | ICD-10-CM | POA: Diagnosis not present

## 2024-02-18 DIAGNOSIS — G609 Hereditary and idiopathic neuropathy, unspecified: Secondary | ICD-10-CM | POA: Diagnosis not present

## 2024-02-18 DIAGNOSIS — G894 Chronic pain syndrome: Secondary | ICD-10-CM | POA: Diagnosis present

## 2024-02-18 DIAGNOSIS — G629 Polyneuropathy, unspecified: Secondary | ICD-10-CM

## 2024-02-18 DIAGNOSIS — M62838 Other muscle spasm: Secondary | ICD-10-CM | POA: Diagnosis not present

## 2024-02-18 DIAGNOSIS — Z7901 Long term (current) use of anticoagulants: Secondary | ICD-10-CM | POA: Diagnosis not present

## 2024-02-18 DIAGNOSIS — G928 Other toxic encephalopathy: Secondary | ICD-10-CM | POA: Insufficient documentation

## 2024-02-18 DIAGNOSIS — G9341 Metabolic encephalopathy: Secondary | ICD-10-CM

## 2024-02-18 DIAGNOSIS — K219 Gastro-esophageal reflux disease without esophagitis: Secondary | ICD-10-CM | POA: Diagnosis not present

## 2024-02-18 DIAGNOSIS — G8250 Quadriplegia, unspecified: Secondary | ICD-10-CM | POA: Insufficient documentation

## 2024-02-18 DIAGNOSIS — Z86718 Personal history of other venous thrombosis and embolism: Secondary | ICD-10-CM | POA: Diagnosis not present

## 2024-02-18 DIAGNOSIS — I10 Essential (primary) hypertension: Secondary | ICD-10-CM | POA: Diagnosis not present

## 2024-02-18 DIAGNOSIS — D649 Anemia, unspecified: Secondary | ICD-10-CM | POA: Insufficient documentation

## 2024-02-18 DIAGNOSIS — N179 Acute kidney failure, unspecified: Secondary | ICD-10-CM | POA: Diagnosis not present

## 2024-02-18 DIAGNOSIS — E785 Hyperlipidemia, unspecified: Secondary | ICD-10-CM | POA: Diagnosis not present

## 2024-02-18 DIAGNOSIS — R4182 Altered mental status, unspecified: Principal | ICD-10-CM

## 2024-02-18 DIAGNOSIS — N3000 Acute cystitis without hematuria: Secondary | ICD-10-CM

## 2024-02-18 DIAGNOSIS — I890 Lymphedema, not elsewhere classified: Secondary | ICD-10-CM

## 2024-02-18 LAB — COMPREHENSIVE METABOLIC PANEL WITH GFR
ALT: 28 U/L (ref 0–44)
AST: 27 U/L (ref 15–41)
Albumin: 2.8 g/dL — ABNORMAL LOW (ref 3.5–5.0)
Alkaline Phosphatase: 89 U/L (ref 38–126)
Anion gap: 8 (ref 5–15)
BUN: 23 mg/dL (ref 8–23)
CO2: 27 mmol/L (ref 22–32)
Calcium: 9.4 mg/dL (ref 8.9–10.3)
Chloride: 106 mmol/L (ref 98–111)
Creatinine, Ser: 1.17 mg/dL — ABNORMAL HIGH (ref 0.44–1.00)
GFR, Estimated: 47 mL/min — ABNORMAL LOW (ref >60)
Glucose, Bld: 99 mg/dL (ref 70–99)
Potassium: 4.1 mmol/L (ref 3.5–5.1)
Sodium: 141 mmol/L (ref 135–145)
Total Bilirubin: 0.2 mg/dL (ref 0.0–1.2)
Total Protein: 6.4 g/dL — ABNORMAL LOW (ref 6.5–8.1)

## 2024-02-18 LAB — I-STAT VENOUS BLOOD GAS, ED
Acid-Base Excess: 5 mmol/L — ABNORMAL HIGH (ref 0.0–2.0)
Bicarbonate: 25.2 mmol/L (ref 20.0–28.0)
Calcium, Ion: 0.94 mmol/L — ABNORMAL LOW (ref 1.15–1.40)
HCT: 39 % (ref 36.0–46.0)
Hemoglobin: 13.3 g/dL (ref 12.0–15.0)
O2 Saturation: 100 %
Potassium: 4.9 mmol/L (ref 3.5–5.1)
Sodium: 139 mmol/L (ref 135–145)
TCO2: 26 mmol/L (ref 22–32)
pCO2, Ven: 24.6 mmHg — ABNORMAL LOW (ref 44–60)
pH, Ven: 7.619 (ref 7.25–7.43)
pO2, Ven: 170 mmHg — ABNORMAL HIGH (ref 32–45)

## 2024-02-18 LAB — I-STAT CHEM 8, ED
BUN: 32 mg/dL — ABNORMAL HIGH (ref 8–23)
Calcium, Ion: 0.94 mmol/L — ABNORMAL LOW (ref 1.15–1.40)
Chloride: 110 mmol/L (ref 98–111)
Creatinine, Ser: 1.2 mg/dL — ABNORMAL HIGH (ref 0.44–1.00)
Glucose, Bld: 92 mg/dL (ref 70–99)
HCT: 40 % (ref 36.0–46.0)
Hemoglobin: 13.6 g/dL (ref 12.0–15.0)
Potassium: 4.8 mmol/L (ref 3.5–5.1)
Sodium: 138 mmol/L (ref 135–145)
TCO2: 25 mmol/L (ref 22–32)

## 2024-02-18 LAB — CBC WITH DIFFERENTIAL/PLATELET
Abs Immature Granulocytes: 0.03 10*3/uL (ref 0.00–0.07)
Basophils Absolute: 0 10*3/uL (ref 0.0–0.1)
Basophils Relative: 0 %
Eosinophils Absolute: 0.3 10*3/uL (ref 0.0–0.5)
Eosinophils Relative: 3 %
HCT: 38.1 % (ref 36.0–46.0)
Hemoglobin: 11.9 g/dL — ABNORMAL LOW (ref 12.0–15.0)
Immature Granulocytes: 0 %
Lymphocytes Relative: 24 %
Lymphs Abs: 2.4 10*3/uL (ref 0.7–4.0)
MCH: 30.5 pg (ref 26.0–34.0)
MCHC: 31.2 g/dL (ref 30.0–36.0)
MCV: 97.7 fL (ref 80.0–100.0)
Monocytes Absolute: 0.5 10*3/uL (ref 0.1–1.0)
Monocytes Relative: 5 %
Neutro Abs: 6.6 10*3/uL (ref 1.7–7.7)
Neutrophils Relative %: 68 %
Platelets: 207 10*3/uL (ref 150–400)
RBC: 3.9 MIL/uL (ref 3.87–5.11)
RDW: 14.1 % (ref 11.5–15.5)
WBC: 9.8 10*3/uL (ref 4.0–10.5)
nRBC: 0 % (ref 0.0–0.2)

## 2024-02-18 LAB — PROTIME-INR
INR: 1.6 — ABNORMAL HIGH (ref 0.8–1.2)
Prothrombin Time: 18.8 s — ABNORMAL HIGH (ref 11.4–15.2)

## 2024-02-18 LAB — I-STAT CG4 LACTIC ACID, ED: Lactic Acid, Venous: 1.8 mmol/L (ref 0.5–1.9)

## 2024-02-18 LAB — AMMONIA: Ammonia: 29 umol/L (ref 9–35)

## 2024-02-18 MED ORDER — LACTATED RINGERS IV BOLUS
1000.0000 mL | Freq: Once | INTRAVENOUS | Status: AC
  Administered 2024-02-18: 1000 mL via INTRAVENOUS

## 2024-02-18 NOTE — ED Triage Notes (Signed)
 Patient is coming from [facility] with altered mental status. Normally A&Ox3. Patient reported to EMS that she was having headache, blurred vision, and dizziness for the past 4 days. Via facility [facility] was today. Patient is a quadriplegic. EMS VS 97% RA 102 CBG 230 SBP

## 2024-02-18 NOTE — ED Notes (Signed)
 Was able to collect the blue top for the first set of blood cultures.

## 2024-02-18 NOTE — ED Notes (Signed)
 Iv team at bedside

## 2024-02-18 NOTE — ED Notes (Signed)
 Attempted to in and out cath patient, unsuccessful.

## 2024-02-18 NOTE — ED Provider Notes (Signed)
 [place] EMERGENCY DEPARTMENT AT [facility] Provider Note   CSN: [id number] Arrival date & time: 02/18/24  2033     Patient presents with: Altered Mental Status   Brandy Edwards is a 78 y.o. female.   HPI 78 year old female presents from her facility with altered mental status.  History is initially from EMS who reports an unknown last known well but the patient was telling her that she has been feeling poorly for 4 days.  She has had headache, blurry vision and fatigue.  Patient tells me the symptoms have been on and off for 4 days or so.  Patient's family arrived shortly after the patient and indicated that the patient has been treated for a UTI twice in the last couple weeks and has had altered mental status for at least a week.  Today however, the patient needed to be fed by the daughter which is not typical.  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  She has trouble gripping with her hands but usually can freely move her arms which she is not right now. No fevers or cough.  Prior to Admission medications   Medication Sig Start Date End Date Taking? Authorizing Provider  acetaminophen  (TYLENOL ) 325 MG tablet Take 325 mg by mouth 2 (two) times daily as needed for moderate pain, headache or fever.    [provider]  allopurinol  (ZYLOPRIM ) 100 MG tablet Take 100 mg by mouth daily.     [provider]  amLODipine  (NORVASC ) 10 MG tablet Take 10 mg by mouth daily.     [provider]  atorvastatin  (LIPITOR) 80 MG tablet Take 80 mg by mouth at bedtime. 10/23/21   [provider]  baclofen  (LIORESAL ) 10 MG tablet Take 10 mg by mouth 2 (two) times daily. 03/05/22   [provider]  budesonide  (PULMICORT ) 0.5 MG/2ML nebulizer solution Take 0.5 mg by nebulization 2 (two) times daily.    [provider]  chlorhexidine  (PERIDEX ) 0.12 % solution Use as directed 30 mLs in the mouth or throat  2 (two) times daily.    [provider]  ezetimibe  (ZETIA ) 10 MG tablet Take 10 mg by mouth daily. Patient not taking: Reported on 10/25/2022 10/13/21   [provider]  ferrous sulfate 325 (65 FE) MG EC tablet Take 325 mg by mouth daily with breakfast.    [provider]  fluticasone  (FLONASE ) 50 MCG/ACT nasal spray Place 2 sprays into both nostrils daily.    [provider]  furosemide (LASIX) 40 MG tablet Take 40 mg by mouth daily.    [provider]  levETIRAcetam  (KEPPRA ) 500 MG tablet Take 1 tablet in the morning, take 2 at bedtime Patient taking differently: Take 500-1,000 mg by mouth See admin instructions. 500 mg in the morning 1000 mg at bedtime 03/18/20   Gayland Lauraine PARAS, NP  lidocaine  4 % Place 1 patch onto the skin daily. Apply to each shoulder    [provider]  loperamide (IMODIUM) 2 MG capsule Take 2 mg by mouth as needed for diarrhea or loose stools.    [provider]  lubiprostone (AMITIZA) 24 MCG capsule Take 24 mcg by mouth 2 (two) times daily with a meal.    [provider]  Melatonin 10 MG TABS Take 10 mg by mouth at bedtime.    [provider]  Menthol , Topical Analgesic, (BIOFREEZE) 4 % GEL Apply 1 application. topically at  bedtime. Right shoulder    [provider]  metoprolol succinate (TOPROL-XL) 25 MG 24 hr tablet Take 25 mg by mouth daily.    [provider]  montelukast  (SINGULAIR ) 10 MG tablet Take 10 mg by mouth at bedtime.     [provider]  Multiple Vitamin (MULTI-VITAMINS) TABS Take 1 tablet by mouth daily.    [provider]  nystatin (MYCOSTATIN/NYSTOP) powder Apply 1 application. topically 3 (three) times daily as needed (rash on body folds and left underarm).    [provider]  pantoprazole  (PROTONIX ) 40 MG tablet Take 40 mg by mouth 2 (two) times daily.    [provider]  polyethylene glycol (MIRALAX  / GLYCOLAX ) 17 g packet  Take 17 g by mouth daily as needed for mild constipation.    [provider]  potassium chloride  (KLOR-CON ) 10 MEQ tablet Take 10 mEq by mouth daily. 10/12/21   [provider]  pregabalin  (LYRICA ) 75 MG capsule Take 75 mg by mouth 2 (two) times daily.    [provider]  RESTASIS  0.05 % ophthalmic emulsion  03/04/22   [provider]  rivaroxaban  (XARELTO ) 20 MG TABS tablet Take 20 mg by mouth daily with supper.     [provider]  sennosides-docusate sodium (SENOKOT-S) 8.6-50 MG tablet Take 2 tablets by mouth at bedtime. Patient not taking: Reported on 10/25/2022    [provider]  simethicone (MYLICON) 80 MG chewable tablet Chew 80 mg by mouth every 6 (six) hours as needed for flatulence. Patient not taking: Reported on 10/25/2022    [provider]  sucralfate (CARAFATE) 1 g tablet Take 1 g by mouth 4 (four) times daily. 10/20/21   [provider]  traMADol -acetaminophen  (ULTRACET ) 37.5-325 MG tablet Take 2 tablets by mouth 3 (three) times daily. 01/12/22   Pahwani, Ravi, MD  VITAMIN D PO Take 50,000 Units by mouth every 30 (thirty) days. 1st of the month    [provider]  Zinc Oxide 22 % CREA Apply 1 application. topically every 4 (four) hours.    [provider]    Allergies: Penicillins, Ivp dye [iodinated contrast media], and Oxycodone-acetaminophen     Review of Systems  Unable to perform ROS: Mental status change    Updated Vital Signs BP 119/69   Pulse (!) 56   Temp (!) 97 F (36.1 C) (Rectal)   Resp (!) 9   LMP  (LMP Unknown)   SpO2 93%   Physical Exam Vitals and nursing note reviewed.  Constitutional:      Appearance: She is well-developed. She is obese. She is not diaphoretic.  HENT:     Head: Normocephalic and atraumatic.   Cardiovascular:     Rate and Rhythm: Normal rate and regular rhythm.     Heart sounds: Normal heart sounds.  Pulmonary:     Effort: Pulmonary effort is  normal.     Breath sounds: Normal breath sounds.  Abdominal:     Palpations: Abdomen is soft.     Tenderness: There is no abdominal tenderness.   Skin:    General: Skin is warm and dry.   Neurological:     Mental Status: She is alert.     Comments: Patient is awake and alert but speaks softly and appears sleepy.  She is able to carry on most conversation.  She weakly moves both arms symmetrically but this is weaker than baseline.  Cannot move her legs at baseline.    (all labs ordered  are listed, but only abnormal results are displayed) Labs Reviewed  COMPREHENSIVE METABOLIC PANEL WITH GFR - Abnormal; Notable for the following components:      Result Value   Creatinine, Ser 1.17 (*)    Total Protein 6.4 (*)    Albumin 2.8 (*)    GFR, Estimated 47 (*)    All other components within normal limits  CBC WITH DIFFERENTIAL/PLATELET - Abnormal; Notable for the following components:   Hemoglobin 11.9 (*)    All other components within normal limits  PROTIME-INR - Abnormal; Notable for the following components:   Prothrombin Time 18.8 (*)    INR 1.6 (*)    All other components within normal limits  I-STAT CHEM 8, ED - Abnormal; Notable for the following components:   BUN 32 (*)    Creatinine, Ser 1.20 (*)    Calcium , Ion 0.94 (*)    All other components within normal limits  I-STAT VENOUS BLOOD GAS, ED - Abnormal; Notable for the following components:   pH, Ven 7.619 (*)    pCO2, Ven 24.6 (*)    pO2, Ven 170 (*)    Acid-Base Excess 5.0 (*)    Calcium , Ion 0.94 (*)    All other components within normal limits  CULTURE, BLOOD (ROUTINE X 2)  CULTURE, BLOOD (ROUTINE X 2)  AMMONIA  URINALYSIS, W/ REFLEX TO CULTURE (INFECTION SUSPECTED)  I-STAT CG4 LACTIC ACID, ED  I-STAT CG4 LACTIC ACID, ED    EKG: EKG Interpretation Date/Time:  Saturday February 18 2024 20:47:46 EDT Ventricular Rate:  64 PR Interval:  188 QRS Duration:  109 QT Interval:  414 QTC Calculation: 428 R  Axis:   48  Text Interpretation: Sinus rhythm Artifact in lead(s) I II III aVR aVL aVF V1 V4 no acute ST/T changes Confirmed by Freddi Hamilton [phone]) on 02/18/2024 9:21:15 PM  Radiology: [facility] Chest Port 1 View Result Date: 02/18/2024 CLINICAL DATA:  AMS EXAM: PORTABLE CHEST - 1 VIEW COMPARISON:  09/30/2017. FINDINGS: The heart size and mediastinal contours are within normal limits. Both lungs are clear. No pneumothorax or pleural effusion. Aorta is calcified. There are thoracic degenerative changes. IMPRESSION: No acute cardiopulmonary disease. Electronically Signed   By: Fonda Field M.D.   On: 02/18/2024 21:49     Procedures   Medications Ordered in the ED  lactated ringers  bolus 1,000 mL (0 mLs Intravenous Stopped 02/18/24 2310)                                    Medical Decision Making Amount and/or Complexity of Data Reviewed Labs: ordered.    Details: Mild AKI. Radiology: ordered and independent interpretation performed.    Details: No pneumonia ECG/medicine tests: ordered and independent interpretation performed.    Details: No ischemia   Patient presents with altered mental status.  Seems to be progressive over the last week or more.  Seems like she has been treated for UTIs over the last couple weeks as well.  Labs show a mild AKI, given some fluids.  Otherwise, CT head is currently pending.  She is on Xarelto .  No fevers here.  UA and CT head pending. Care transferred to Dr. Palumbo.     Final diagnoses:  None    ED Discharge Orders     None          Freddi Hamilton, MD 02/18/24 2313

## 2024-02-19 ENCOUNTER — Encounter (HOSPITAL_COMMUNITY): Payer: Self-pay | Admitting: Internal Medicine

## 2024-02-19 DIAGNOSIS — Z8669 Personal history of other diseases of the nervous system and sense organs: Secondary | ICD-10-CM

## 2024-02-19 DIAGNOSIS — J45909 Unspecified asthma, uncomplicated: Secondary | ICD-10-CM | POA: Insufficient documentation

## 2024-02-19 DIAGNOSIS — N3000 Acute cystitis without hematuria: Secondary | ICD-10-CM | POA: Diagnosis not present

## 2024-02-19 DIAGNOSIS — I890 Lymphedema, not elsewhere classified: Secondary | ICD-10-CM

## 2024-02-19 DIAGNOSIS — N179 Acute kidney failure, unspecified: Secondary | ICD-10-CM | POA: Diagnosis not present

## 2024-02-19 DIAGNOSIS — G40909 Epilepsy, unspecified, not intractable, without status epilepticus: Secondary | ICD-10-CM

## 2024-02-19 DIAGNOSIS — G9341 Metabolic encephalopathy: Secondary | ICD-10-CM

## 2024-02-19 DIAGNOSIS — Z86718 Personal history of other venous thrombosis and embolism: Secondary | ICD-10-CM

## 2024-02-19 DIAGNOSIS — I10 Essential (primary) hypertension: Secondary | ICD-10-CM

## 2024-02-19 DIAGNOSIS — G629 Polyneuropathy, unspecified: Secondary | ICD-10-CM

## 2024-02-19 DIAGNOSIS — J4520 Mild intermittent asthma, uncomplicated: Secondary | ICD-10-CM

## 2024-02-19 DIAGNOSIS — M62838 Other muscle spasm: Secondary | ICD-10-CM

## 2024-02-19 DIAGNOSIS — G894 Chronic pain syndrome: Secondary | ICD-10-CM

## 2024-02-19 LAB — URINALYSIS, W/ REFLEX TO CULTURE (INFECTION SUSPECTED)
Bilirubin Urine: NEGATIVE
Glucose, UA: NEGATIVE mg/dL
Hgb urine dipstick: NEGATIVE
Ketones, ur: NEGATIVE mg/dL
Nitrite: NEGATIVE
Protein, ur: NEGATIVE mg/dL
Specific Gravity, Urine: 1.006 (ref 1.005–1.030)
pH: 5 (ref 5.0–8.0)

## 2024-02-19 LAB — COMPREHENSIVE METABOLIC PANEL WITH GFR
ALT: 25 U/L (ref 0–44)
AST: 23 U/L (ref 15–41)
Albumin: 2.5 g/dL — ABNORMAL LOW (ref 3.5–5.0)
Alkaline Phosphatase: 76 U/L (ref 38–126)
Anion gap: 6 (ref 5–15)
BUN: 21 mg/dL (ref 8–23)
CO2: 26 mmol/L (ref 22–32)
Calcium: 8.8 mg/dL — ABNORMAL LOW (ref 8.9–10.3)
Chloride: 107 mmol/L (ref 98–111)
Creatinine, Ser: 1.06 mg/dL — ABNORMAL HIGH (ref 0.44–1.00)
GFR, Estimated: 53 mL/min — ABNORMAL LOW (ref >60)
Glucose, Bld: 81 mg/dL (ref 70–99)
Potassium: 3.5 mmol/L (ref 3.5–5.1)
Sodium: 139 mmol/L (ref 135–145)
Total Bilirubin: 0.4 mg/dL (ref 0.0–1.2)
Total Protein: 5.7 g/dL — ABNORMAL LOW (ref 6.5–8.1)

## 2024-02-19 LAB — CBC
HCT: 33.5 % — ABNORMAL LOW (ref 36.0–46.0)
Hemoglobin: 10.5 g/dL — ABNORMAL LOW (ref 12.0–15.0)
MCH: 30.9 pg (ref 26.0–34.0)
MCHC: 31.3 g/dL (ref 30.0–36.0)
MCV: 98.5 fL (ref 80.0–100.0)
Platelets: 181 10*3/uL (ref 150–400)
RBC: 3.4 MIL/uL — ABNORMAL LOW (ref 3.87–5.11)
RDW: 14 % (ref 11.5–15.5)
WBC: 6.5 10*3/uL (ref 4.0–10.5)
nRBC: 0 % (ref 0.0–0.2)

## 2024-02-19 LAB — AMMONIA: Ammonia: 25 umol/L (ref 9–35)

## 2024-02-19 MED ORDER — PREGABALIN 25 MG PO CAPS
75.0000 mg | ORAL_CAPSULE | Freq: Two times a day (BID) | ORAL | Status: DC
  Administered 2024-02-19: 75 mg via ORAL
  Filled 2024-02-19 (×2): qty 3

## 2024-02-19 MED ORDER — BUDESONIDE 0.5 MG/2ML IN SUSP
0.5000 mg | Freq: Two times a day (BID) | RESPIRATORY_TRACT | Status: DC
  Administered 2024-02-19: 0.5 mg via RESPIRATORY_TRACT
  Filled 2024-02-19: qty 2

## 2024-02-19 MED ORDER — IBUPROFEN 400 MG PO TABS
400.0000 mg | ORAL_TABLET | Freq: Four times a day (QID) | ORAL | Status: DC | PRN
  Administered 2024-02-19: 400 mg via ORAL
  Filled 2024-02-19: qty 1

## 2024-02-19 MED ORDER — LEVETIRACETAM 500 MG PO TABS
1000.0000 mg | ORAL_TABLET | Freq: Every day | ORAL | Status: DC
  Administered 2024-02-19: 1000 mg via ORAL
  Filled 2024-02-19: qty 2

## 2024-02-19 MED ORDER — FLUTICASONE PROPIONATE 50 MCG/ACT NA SUSP
2.0000 | Freq: Every day | NASAL | Status: DC
  Filled 2024-02-19: qty 16

## 2024-02-19 MED ORDER — BACLOFEN 10 MG PO TABS
10.0000 mg | ORAL_TABLET | Freq: Two times a day (BID) | ORAL | Status: DC
  Administered 2024-02-19 (×2): 10 mg via ORAL
  Filled 2024-02-19 (×2): qty 1

## 2024-02-19 MED ORDER — AMLODIPINE BESYLATE 5 MG PO TABS
10.0000 mg | ORAL_TABLET | Freq: Every day | ORAL | Status: DC
  Filled 2024-02-19: qty 2

## 2024-02-19 MED ORDER — RIVAROXABAN 10 MG PO TABS
20.0000 mg | ORAL_TABLET | Freq: Every day | ORAL | Status: DC
  Administered 2024-02-19: 20 mg via ORAL
  Filled 2024-02-19: qty 2

## 2024-02-19 MED ORDER — POLYETHYLENE GLYCOL 3350 17 G PO PACK: 17.0000 g | PACK | Freq: Every day | ORAL | Status: DC | PRN

## 2024-02-19 MED ORDER — FERROUS SULFATE 325 (65 FE) MG PO TABS
325.0000 mg | ORAL_TABLET | Freq: Every day | ORAL | Status: DC
  Administered 2024-02-19: 325 mg via ORAL
  Filled 2024-02-19: qty 1

## 2024-02-19 MED ORDER — ONDANSETRON HCL 4 MG PO TABS: 4.0000 mg | ORAL_TABLET | Freq: Four times a day (QID) | ORAL | Status: DC | PRN

## 2024-02-19 MED ORDER — SODIUM CHLORIDE 0.9% FLUSH: 3.0000 mL | INTRAVENOUS | Status: DC | PRN

## 2024-02-19 MED ORDER — MONTELUKAST SODIUM 10 MG PO TABS
10.0000 mg | ORAL_TABLET | Freq: Every day | ORAL | Status: DC
  Administered 2024-02-19: 10 mg via ORAL
  Filled 2024-02-19: qty 1

## 2024-02-19 MED ORDER — MELATONIN 5 MG PO TABS
10.0000 mg | ORAL_TABLET | Freq: Every day | ORAL | Status: DC
  Administered 2024-02-19: 10 mg via ORAL
  Filled 2024-02-19: qty 2

## 2024-02-19 MED ORDER — SODIUM CHLORIDE 0.9% FLUSH
3.0000 mL | Freq: Two times a day (BID) | INTRAVENOUS | Status: DC
  Administered 2024-02-19: 3 mL via INTRAVENOUS

## 2024-02-19 MED ORDER — SODIUM CHLORIDE 0.9 % IV SOLN: 250.0000 mL | INTRAVENOUS | Status: DC | PRN

## 2024-02-19 MED ORDER — EZETIMIBE 10 MG PO TABS
10.0000 mg | ORAL_TABLET | Freq: Every day | ORAL | Status: DC
  Administered 2024-02-19: 10 mg via ORAL
  Filled 2024-02-19: qty 1

## 2024-02-19 MED ORDER — LUBIPROSTONE 24 MCG PO CAPS
24.0000 ug | ORAL_CAPSULE | Freq: Two times a day (BID) | ORAL | Status: DC
  Administered 2024-02-19: 24 ug via ORAL
  Filled 2024-02-19 (×3): qty 1

## 2024-02-19 MED ORDER — LACTATED RINGERS IV SOLN: INTRAVENOUS | Status: DC

## 2024-02-19 MED ORDER — ADULT MULTIVITAMIN W/MINERALS CH
1.0000 | ORAL_TABLET | Freq: Every day | ORAL | Status: DC
  Administered 2024-02-19: 1 via ORAL
  Filled 2024-02-19: qty 1

## 2024-02-19 MED ORDER — METOPROLOL SUCCINATE ER 25 MG PO TB24: 25.0000 mg | ORAL_TABLET | Freq: Every day | ORAL | Status: DC

## 2024-02-19 MED ORDER — CHLORHEXIDINE GLUCONATE 0.12 % MT SOLN
30.0000 mL | Freq: Two times a day (BID) | OROMUCOSAL | Status: DC
  Filled 2024-02-19 (×3): qty 30

## 2024-02-19 MED ORDER — ONDANSETRON HCL 4 MG/2ML IJ SOLN: 4.0000 mg | Freq: Four times a day (QID) | INTRAMUSCULAR | Status: DC | PRN

## 2024-02-19 MED ORDER — NYSTATIN 100000 UNIT/GM EX POWD: 1.0000 | Freq: Three times a day (TID) | CUTANEOUS | Status: DC | PRN

## 2024-02-19 MED ORDER — PANTOPRAZOLE SODIUM 40 MG PO TBEC
40.0000 mg | DELAYED_RELEASE_TABLET | Freq: Two times a day (BID) | ORAL | Status: DC
  Administered 2024-02-19 (×2): 40 mg via ORAL
  Filled 2024-02-19 (×2): qty 1

## 2024-02-19 MED ORDER — ATORVASTATIN CALCIUM 40 MG PO TABS: 80.0000 mg | ORAL_TABLET | Freq: Every day | ORAL | Status: DC

## 2024-02-19 MED ORDER — ACETAMINOPHEN 325 MG PO TABS: 650.0000 mg | ORAL_TABLET | Freq: Four times a day (QID) | ORAL | 2 refills | Status: AC | PRN

## 2024-02-19 MED ORDER — ACETAMINOPHEN 500 MG PO TABS
1000.0000 mg | ORAL_TABLET | Freq: Four times a day (QID) | ORAL | Status: DC
  Administered 2024-02-19: 1000 mg via ORAL
  Filled 2024-02-19: qty 2

## 2024-02-19 MED ORDER — ACETAMINOPHEN 325 MG PO TABS: 650.0000 mg | ORAL_TABLET | Freq: Four times a day (QID) | ORAL | Status: AC

## 2024-02-19 MED ORDER — BACLOFEN 10 MG PO TABS: 10.0000 mg | ORAL_TABLET | Freq: Four times a day (QID) | ORAL | Status: AC

## 2024-02-19 MED ORDER — VITAMIN D (ERGOCALCIFEROL) 1.25 MG (50000 UNIT) PO CAPS: 50000.0000 [IU] | ORAL_CAPSULE | ORAL | Status: DC

## 2024-02-19 MED ORDER — LEVETIRACETAM 500 MG PO TABS
500.0000 mg | ORAL_TABLET | Freq: Every day | ORAL | Status: DC
  Administered 2024-02-19: 500 mg via ORAL
  Filled 2024-02-19: qty 1

## 2024-02-19 MED ORDER — ALBUTEROL SULFATE (2.5 MG/3ML) 0.083% IN NEBU: 2.5000 mg | INHALATION_SOLUTION | Freq: Four times a day (QID) | RESPIRATORY_TRACT | Status: DC | PRN

## 2024-02-19 MED ORDER — FUROSEMIDE 40 MG PO TABS: 40.0000 mg | ORAL_TABLET | Freq: Every day | ORAL | Status: AC | PRN

## 2024-02-19 MED ORDER — SODIUM CHLORIDE 0.9 % IV SOLN
1.0000 g | Freq: Once | INTRAVENOUS | Status: AC
  Administered 2024-02-19: 1 g via INTRAVENOUS
  Filled 2024-02-19: qty 10

## 2024-02-19 MED ORDER — SODIUM CHLORIDE 0.9 % IV SOLN: 1.0000 g | INTRAVENOUS | Status: DC

## 2024-02-19 NOTE — ED Notes (Signed)
 Secretary to call ptar.

## 2024-02-19 NOTE — ED Notes (Signed)
 Refused bp check.

## 2024-02-19 NOTE — ED Notes (Signed)
 Pt sitting upright and provided meal tray. Pt resting comfortably and eating at this time.
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
## 2024-02-19 NOTE — Progress Notes (Signed)
 CSW received consult for patient due to her coming from [facility] (SNF). Patient appears to be from LTC at the facility.  CSW attempted to reach admissions staff at [facility] without success - a text was sent requesting a return call.  Report can be called to [phone].   CSW spoke with Darina at [facility] who confirms patient is LTC and can return to the facility.  Niels Portugal, MSW, LCSW Transitions of Care  Clinical Social Worker II [phone]

## 2024-02-19 NOTE — ED Notes (Signed)
 Amitiza coming from main pharmacy. Delay in administration due to same.

## 2024-02-19 NOTE — ED Notes (Addendum)
 Pt provided water, lunch tray.

## 2024-02-19 NOTE — Care Management Obs Status (Signed)
 MEDICARE OBSERVATION STATUS NOTIFICATION   Patient Details  Name: Brandy Edwards MRN: [id number] Date of Birth: October 07, 1945   Medicare Observation Status Notification Given:  Yes    Corean JAYSON Canary, RN 02/19/2024, 10:07 AM

## 2024-02-19 NOTE — ED Notes (Addendum)
 Pt left with PT[facility] during report to this RN. Karleen RN gave report to EMS.

## 2024-02-20 LAB — URINE CULTURE: Culture: <10000 — AB

## 2024-02-23 LAB — CULTURE, BLOOD (ROUTINE X 2)
Culture: NO GROWTH
Culture: NO GROWTH

## 2024-05-24 ENCOUNTER — Ambulatory Visit (INDEPENDENT_AMBULATORY_CARE_PROVIDER_SITE_OTHER): Admitting: Nurse Practitioner

## 2024-06-30 DEATH — deceased
